# Patient Record
Sex: Female | Born: 1975 | Race: Black or African American | Hispanic: No | Marital: Single | Smoking: Never smoker
Health system: Southern US, Community
[De-identification: ages and names within clinical notes are randomized; demographics above are authoritative.]

## PROBLEM LIST (undated history)

## (undated) DIAGNOSIS — E119 Type 2 diabetes mellitus without complications: Secondary | ICD-10-CM

## (undated) DIAGNOSIS — I10 Essential (primary) hypertension: Secondary | ICD-10-CM

---

## 2017-05-01 ENCOUNTER — Emergency Department: Payer: Medicare Other

## 2017-05-01 ENCOUNTER — Encounter: Payer: Self-pay | Admitting: Emergency Medicine

## 2017-05-01 ENCOUNTER — Emergency Department
Admission: EM | Admit: 2017-05-01 | Discharge: 2017-05-01 | Disposition: A | Discharge status: Home / Self Care | Payer: Medicare Other | Attending: Emergency Medicine | Admitting: Emergency Medicine

## 2017-05-01 DIAGNOSIS — I10 Essential (primary) hypertension: Secondary | ICD-10-CM | POA: Diagnosis not present

## 2017-05-01 DIAGNOSIS — S91001A Unspecified open wound, right ankle, initial encounter: Secondary | ICD-10-CM | POA: Diagnosis present

## 2017-05-01 DIAGNOSIS — Y999 Unspecified external cause status: Secondary | ICD-10-CM | POA: Insufficient documentation

## 2017-05-01 DIAGNOSIS — L97512 Non-pressure chronic ulcer of other part of right foot with fat layer exposed: Secondary | ICD-10-CM | POA: Insufficient documentation

## 2017-05-01 DIAGNOSIS — E109 Type 1 diabetes mellitus without complications: Secondary | ICD-10-CM | POA: Diagnosis not present

## 2017-05-01 DIAGNOSIS — B86 Scabies: Secondary | ICD-10-CM | POA: Diagnosis not present

## 2017-05-01 DIAGNOSIS — Y939 Activity, unspecified: Secondary | ICD-10-CM | POA: Diagnosis not present

## 2017-05-01 DIAGNOSIS — S80861A Insect bite (nonvenomous), right lower leg, initial encounter: Secondary | ICD-10-CM | POA: Insufficient documentation

## 2017-05-01 DIAGNOSIS — W57XXXA Bitten or stung by nonvenomous insect and other nonvenomous arthropods, initial encounter: Secondary | ICD-10-CM | POA: Diagnosis not present

## 2017-05-01 DIAGNOSIS — E10621 Type 1 diabetes mellitus with foot ulcer: Secondary | ICD-10-CM

## 2017-05-01 DIAGNOSIS — Y929 Unspecified place or not applicable: Secondary | ICD-10-CM | POA: Insufficient documentation

## 2017-05-01 DIAGNOSIS — S80862A Insect bite (nonvenomous), left lower leg, initial encounter: Secondary | ICD-10-CM | POA: Diagnosis not present

## 2017-05-01 HISTORY — DX: Essential (primary) hypertension: I10

## 2017-05-01 HISTORY — DX: Type 2 diabetes mellitus without complications: E11.9

## 2017-05-01 LAB — COMPREHENSIVE METABOLIC PANEL
ALBUMIN: 3.1 g/dL — AB (ref 3.5–5.0)
ALK PHOS: 204 U/L — AB (ref 38–126)
ALT: 20 U/L (ref 14–54)
AST: 29 U/L (ref 15–41)
Anion gap: 9 (ref 5–15)
BUN: 9 mg/dL (ref 6–20)
CALCIUM: 9.2 mg/dL (ref 8.9–10.3)
CHLORIDE: 108 mmol/L (ref 101–111)
CO2: 23 mmol/L (ref 22–32)
CREATININE: 0.32 mg/dL — AB (ref 0.44–1.00)
GFR calc Af Amer: >60 mL/min (ref >60)
GFR calc non Af Amer: >60 mL/min (ref >60)
GLUCOSE: 123 mg/dL — AB (ref 65–99)
Potassium: 3.5 mmol/L (ref 3.5–5.1)
SODIUM: 140 mmol/L (ref 135–145)
Total Bilirubin: 0.7 mg/dL (ref 0.3–1.2)
Total Protein: 7.2 g/dL (ref 6.5–8.1)

## 2017-05-01 LAB — CBC WITH DIFFERENTIAL/PLATELET
BASOS PCT: 0 %
Basophils Absolute: 0 10*3/uL (ref 0–0.1)
EOS ABS: 0.2 10*3/uL (ref 0–0.7)
Eosinophils Relative: 2 %
HCT: 29.1 % — ABNORMAL LOW (ref 35.0–47.0)
HEMOGLOBIN: 9.5 g/dL — AB (ref 12.0–16.0)
LYMPHS ABS: 1.7 10*3/uL (ref 1.0–3.6)
Lymphocytes Relative: 23 %
MCH: 22.9 pg — AB (ref 26.0–34.0)
MCHC: 32.8 g/dL (ref 32.0–36.0)
MCV: 69.9 fL — ABNORMAL LOW (ref 80.0–100.0)
MONO ABS: 1.1 10*3/uL — AB (ref 0.2–0.9)
MONOS PCT: 15 %
Neutro Abs: 4.6 10*3/uL (ref 1.4–6.5)
Neutrophils Relative %: 60 %
Platelets: 291 10*3/uL (ref 150–440)
RBC: 4.17 MIL/uL (ref 3.80–5.20)
RDW: 14.8 % — AB (ref 11.5–14.5)
WBC: 7.6 10*3/uL (ref 3.6–11.0)

## 2017-05-01 LAB — GLUCOSE, CAPILLARY: GLUCOSE-CAPILLARY: 106 mg/dL — AB (ref 65–99)

## 2017-05-01 LAB — LACTIC ACID, PLASMA: Lactic Acid, Venous: 1.6 mmol/L (ref 0.5–1.9)

## 2017-05-01 MED ORDER — SODIUM CHLORIDE 0.9 % IV BOLUS (SEPSIS)
1000.0000 mL | Freq: Once | INTRAVENOUS | Status: AC
  Administered 2017-05-01: 1000 mL via INTRAVENOUS

## 2017-05-01 MED ORDER — PERMETHRIN 1 % EX LOTN: 1.0000 "application " | TOPICAL_LOTION | Freq: Once | CUTANEOUS | 1 refills | Status: AC

## 2017-05-01 MED ORDER — IVERMECTIN 3 MG PO TABS: 3.0000 mg | ORAL_TABLET | Freq: Once | ORAL | 0 refills | Status: AC

## 2017-05-01 MED ORDER — CLINDAMYCIN HCL 300 MG PO CAPS: 300.0000 mg | ORAL_CAPSULE | Freq: Four times a day (QID) | ORAL | 0 refills | Status: AC

## 2017-05-01 MED ORDER — CLINDAMYCIN PHOSPHATE 600 MG/50ML IV SOLN
600.0000 mg | Freq: Once | INTRAVENOUS | Status: AC
  Administered 2017-05-01: 600 mg via INTRAVENOUS
  Filled 2017-05-01: qty 50

## 2017-05-01 NOTE — ED Triage Notes (Addendum)
Presents with possible bed bug bites.. States she has had these bites to ankles/feet for several months  Also noticed bites to her back   Positive itching left foot swollen and small open wound noted

## 2017-05-01 NOTE — ED Provider Notes (Signed)
[facility] Emergency Department Provider Note  ____________________________________________  Time seen: Approximately 4:17 PM  I have reviewed the triage vital signs and the nursing notes.   HISTORY  Chief Complaint Insect Bite    HPI Lori Pope is a 41 y.o. female who presents to emergency department complaining of possible bed bug bites, rash/bites to abdomen and back, wound to the right ankle. Patient is a very poor historian. She reports that she has had bedbugs have been providing her bilateral lower extremities. This is been ongoing for weeks. Patient also endorses rash to the abdomen.  Patient also endorses wound to the right medial ankle. Patient reports that she has been covering it with a Band-Aid but has not sought medical care. Patient reports that has been there for "a while." Patient is not able to be more specific on length of time. Patient reports that his she has had intermittent fevers and chills as well as "fever and my ankle." Patient reports pain. She is able to ambulate on her ankle. When asked, patient reports that she used to have diabetes, but she is not sure anymore as she was told that they just "aren't able to control it." Patient denies taking any medications at home for diabetes. Patient denies checking blood sugars. When asked about neuropathy symptoms, patient states "I can still feel my feet."  Patient reports history of diabetes and hypertension. No other known medical problems. Patient denies any daily medications.   Past Medical History:  Diagnosis Date  . Diabetes mellitus without complication (HCC)   . Hypertension     There are no active problems to display for this patient.   History reviewed. No pertinent surgical history.  Prior to Admission medications   Medication Sig Start Date End Date Taking? Authorizing Provider  diphenhydrAMINE (BENADRYL) 25 mg capsule Take 25 mg by mouth every 6 (six) hours as needed.    Yes [provider]  clindamycin (CLEOCIN) 300 MG capsule Take 1 capsule (300 mg total) by mouth 4 (four) times daily. 05/01/17 05/11/17  Jami Bogdanski, Delorise Royals, PA-C  ivermectin (STROMECTOL) 3 MG TABS tablet Take 1 tablet (3 mg total) by mouth once. Take 1 dose now. Take one dose in one week. 05/01/17 05/01/17  Ellyn Rubiano, Delorise Royals, PA-C  permethrin (PERMETHRIN LICE TREATMENT) 1 % lotion Apply 1 application topically once. Shampoo, rinse and towel dry hair, saturate hair and scalp with permethrin. Use over other affected surfaces on abdomen and back. Rinse after 10 min; repeat in 1 week if needed 05/01/17 05/01/17  Kemia Wendel, Delorise Royals, PA-C    Allergies Vancomycin  No family history on file.  Social History Social History  Substance Use Topics  . Smoking status: Never Smoker  . Smokeless tobacco: Never Used  . Alcohol use No      Review of Systems  Constitutional: No fever/chills Eyes: No visual changes. No discharge ENT: No upper respiratory complaints. Cardiovascular: no chest pain. Respiratory: no cough. No SOB. Gastrointestinal: No abdominal pain.  No nausea, no vomiting.  No diarrhea.  No constipation. Genitourinary: Negative for dysuria. No hematuria Musculoskeletal: Positive for right ankle pain with chronic wound Skin: Positive for bug bites over bilateral lower extremities and abdomen and torso Neurological: Negative for headaches, focal weakness or numbness. 10-point ROS otherwise negative.  ____________________________________________   PHYSICAL EXAM:  VITAL SIGNS: ED Triage Vitals [05/01/17 1550]  Enc Vitals Group     BP      Pulse      Resp  Temp      Temp src      SpO2      Weight      Height      Head Circumference      Peak Flow      Pain Score 6     Pain Loc      Pain Edu?      Excl. in GC?      Constitutional: Alert and oriented. Well appearing and in no acute distress. Eyes: Conjunctivae are normal. PERRL. EOMI. Head:  Atraumatic. ENT:      Ears:       Nose: No congestion/rhinnorhea.      Mouth/Throat: Mucous membranes are moist.  Neck: No stridor.   Hematological/Lymphatic/Immunilogical: No cervical lymphadenopathy. Cardiovascular: Normal rate, regular rhythm. Normal S1 and S2.  Good peripheral circulation. Respiratory: Normal respiratory effort without tachypnea or retractions. Lungs CTAB. Good air entry to the bases with no decreased or absent breath sounds. Musculoskeletal: Full range of motion to all extremities. No gross deformities appreciated. Patient has necrotic wound to the right medial ankle. Significant edema, erythema and region. Subcutaneous tissue is visualized and moaned. Patient has good range of motion to the left ankle. Area is tender to palpation. Area is very firm to palpation but no fluctuance. No drainage is noted. Dorsalis pedis pulse is not well appreciated manually. Dorsalis pedis pulse is appreciated with ultrasound and is marked using permanent marker. Patient has full range of motion all digits to the right foot Neurologic:  Normal speech and language. No gross focal neurologic deficits are appreciated.  Skin:  Skin is warm, dry and intact. Bites scattered over lower extremity consistent with bedbugs. Patient has bite marks with linear excoriations consistent with scabies infestation. Psychiatric: Mood and affect are normal. Speech and behavior are normal. Patient exhibits limited but competent insight and judgement.   ____________________________________________   LABS (all labs ordered are listed, but only abnormal results are displayed)  Labs Reviewed  COMPREHENSIVE METABOLIC PANEL - Abnormal; Notable for the following:       Result Value   Glucose, Bld 123 (*)    Creatinine, Ser 0.32 (*)    Albumin 3.1 (*)    Alkaline Phosphatase 204 (*)    All other components within normal limits  CBC WITH DIFFERENTIAL/PLATELET - Abnormal; Notable for the following:    Hemoglobin 9.5  (*)    HCT 29.1 (*)    MCV 69.9 (*)    MCH 22.9 (*)    RDW 14.8 (*)    Monocytes Absolute 1.1 (*)    All other components within normal limits  GLUCOSE, CAPILLARY - Abnormal; Notable for the following:    Glucose-Capillary 106 (*)    All other components within normal limits  CULTURE, BLOOD (ROUTINE X 2)  CULTURE, BLOOD (ROUTINE X 2)  LACTIC ACID, PLASMA  LACTIC ACID, PLASMA  CBG MONITORING, ED   ____________________________________________  EKG   ____________________________________________  RADIOLOGY Festus Barren Banner Huckaba, personally viewed and evaluated these images (plain radiographs) as part of my medical decision making, as well as reviewing the written report by the radiologist.  Dg Ankle Complete Right  Result Date: 05/01/2017 CLINICAL DATA:  Bites thank: PA for several months, hypertension, diabetes mellitus EXAM: RIGHT ANKLE - COMPLETE 3+ VIEW COMPARISON:  None FINDINGS: Lateral plate and multiple screws at the distal fibula post ORIF. Multiple staples and a single wire at the distal tibia with additional wire fragment at the calcaneus as well. Significant deformities  of the distal fibula and tibia post old fractures. Tibiotalar joint fusion. Degenerative changes of the scattered intertarsal joints. Old screw tracts at the tibia. No acute fracture, dislocation or bone destruction. Scattered soft tissue swelling especially posteriorly overlying the distal Achilles tendon. IMPRESSION: Extensive posttraumatic and postsurgical deformities of the RIGHT ankle. No definite acute bony abnormalities. Electronically Signed   By: Ulyses Southward M.D.   On: 05/01/2017 17:33   [place] Extrem Low Right Ltd  Result Date: 05/01/2017 CLINICAL DATA:  Diabetic pressure ulcer medial right ankle. EXAM: ULTRASOUND RIGHT LOWER EXTREMITY LIMITED TECHNIQUE: Ultrasound examination of the lower extremity soft tissues was performed in the area of clinical concern. COMPARISON:  None. FINDINGS: Focused ultrasound  exam was performed in the medial aspect of the right ankle. Soft tissue edema is evident, but no discrete or drainable fluid collection is identified. IMPRESSION: No evidence for drainable abscess within the subcutaneous tissues of the right ankle. Electronically Signed   By: Kennith Center M.D.   On: 05/01/2017 18:18    ____________________________________________    PROCEDURES  Procedure(s) performed:    Procedures    Medications  sodium chloride 0.9 % bolus 1,000 mL (0 mLs Intravenous Stopped 05/01/17 1857)  clindamycin (CLEOCIN) IVPB 600 mg (600 mg Intravenous New Bag/Given 05/01/17 1856)     ____________________________________________   INITIAL IMPRESSION / ASSESSMENT AND PLAN / ED COURSE  Pertinent labs & imaging results that were available during my care of the patient were reviewed by me and considered in my medical decision making (see chart for details).  Review of the [place] CSRS was performed in accordance of the [facility] prior to dispensing any controlled drugs.     Patient's diagnosis is consistent with diabetic foot ulcer with scabies and bedbug". Patient presented initially with complaints of bedbugs and scabies. Patient also endorsed a wound to the medial right ankle. Patient was evaluated with x-ray and ultrasound to rule out osteomyelitis and significant underlying abscess. Both returned with reassuring results. Labs are surprisingly reassuring with patient's blood sugar within normal limits. Patient verbalizes that she is diabetic but does not take daily medications and does not check her blood glucose. Wound is cleansed and dressed in the emergency department with nonadherent dressing. She is given IV antibiotics. She will be referred to wound care for further management of diabetic foot ulcer. 9, no indication for further workup. Patient is stable to be discharged..  Patient is given ED precautions to return to the ED for any worsening or new  symptoms.     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   IVERMECTIN (STROMECTOL) 3 MG TABS TABLET    Take 1 tablet (3 mg total) by mouth once. Take 1 dose now. Take one dose in one week.   PERMETHRIN (PERMETHRIN LICE TREATMENT) 1 % LOTION    Apply 1 application topically once. Shampoo, rinse and towel dry hair, saturate hair and scalp with permethrin. Use over other affected surfaces on abdomen and back. Rinse after 10 min; repeat in 1 week if needed        This chart was dictated using  voice recognition software/Dragon. Despite best efforts to proofread, errors can occur which can change the meaning. Any change was purely unintentional.    Racheal PatchesCuthriell, Masey Scheiber D, PA-C 05/01/17 2131    Phineas SemenGoodman, Graydon, MD 05/01/17 2256

## 2017-05-01 NOTE — ED Notes (Signed)
Patient informed that discharge papers are ready. Given phone to try to contact ride.

## 2017-05-05 ENCOUNTER — Encounter: Payer: Self-pay | Admitting: Emergency Medicine

## 2017-05-05 ENCOUNTER — Emergency Department
Admission: EM | Admit: 2017-05-05 | Discharge: 2017-05-05 | Disposition: A | Discharge status: Home / Self Care | Payer: Medicare Other | Attending: Emergency Medicine | Admitting: Emergency Medicine

## 2017-05-05 DIAGNOSIS — W57XXXD Bitten or stung by nonvenomous insect and other nonvenomous arthropods, subsequent encounter: Secondary | ICD-10-CM | POA: Insufficient documentation

## 2017-05-05 DIAGNOSIS — E10621 Type 1 diabetes mellitus with foot ulcer: Secondary | ICD-10-CM | POA: Diagnosis not present

## 2017-05-05 DIAGNOSIS — B86 Scabies: Secondary | ICD-10-CM | POA: Insufficient documentation

## 2017-05-05 DIAGNOSIS — I10 Essential (primary) hypertension: Secondary | ICD-10-CM | POA: Diagnosis not present

## 2017-05-05 DIAGNOSIS — L97512 Non-pressure chronic ulcer of other part of right foot with fat layer exposed: Secondary | ICD-10-CM | POA: Diagnosis not present

## 2017-05-05 DIAGNOSIS — R21 Rash and other nonspecific skin eruption: Secondary | ICD-10-CM | POA: Diagnosis present

## 2017-05-05 MED ORDER — CLINDAMYCIN HCL 150 MG PO CAPS
300.0000 mg | ORAL_CAPSULE | Freq: Once | ORAL | Status: AC
  Administered 2017-05-05: 300 mg via ORAL
  Filled 2017-05-05: qty 2

## 2017-05-05 NOTE — ED Notes (Signed)
Discharge instructions given to pt  The cab was called and will inform pt when cab gets here

## 2017-05-05 NOTE — ED Notes (Signed)
Cab has arrived to take patient home, patient taken to lobby

## 2017-05-05 NOTE — ED Triage Notes (Signed)
Brought in via ems from Health dept for rash  Was seen 3 days ago for scabies and bed bug bites  Was unable to get her rx filled for her antibiotics . States she has no place to stay   Requesting to speak to Child psychotherapistsocial worker.

## 2017-05-05 NOTE — ED Provider Notes (Signed)
[facility] Emergency Department Provider Note  ____________________________________________  Time seen: Approximately 5:01 PM  I have reviewed the triage vital signs and the nursing notes.   HISTORY  Chief Complaint Rash    HPI Lori Pope is a 41 y.o. female who presents emergency department for further complaint of bug bites, scabies, diabetic foot wound. Patient was seen by myself 4 days earlier and diagnosed with the above diagnosis. Patient had presented to health department to request assistance filling her prescribed medications. Health department refused to see the patient and would not let her in the front doors. EMS was called to transport the patient to the emergency department. Patient has no complaints at this time. She is homeless, having been refused shelter at the homeless shelter after she was in a verbal altercation with another resident homeless shelter over reported felt of personal belongings. Patient had been staying at a relatives house but is no longer able to stay there. Patient endorses ongoing issues with bedbug bites, scabies, for ulcer. However she denies any new complaints. She denies any worsening of her previously diagnosed complaints.  Patient presented to the emergency department via EMS from the health Department. Per EMS, they were called to the patient because "the health Department will not see her because they reported that they were closing, did not want to be exposed to scabies, and couldn't deal with the patient and placing her in a new shelter." EMS was told to bring her"to the emergency department and they will get social workers involved to adequately address the patient." EMS reports that the health department would not allow the patient in their front doors. Patient was found outside. Patient was not evaluated by any staff at the health department.   Past Medical History:  Diagnosis Date  . Diabetes mellitus without  complication (HCC)   . Hypertension     There are no active problems to display for this patient.   History reviewed. No pertinent surgical history.  Prior to Admission medications   Medication Sig Start Date End Date Taking? Authorizing Provider  clindamycin (CLEOCIN) 300 MG capsule Take 1 capsule (300 mg total) by mouth 4 (four) times daily. 05/01/17 05/11/17  Cuthriell, Delorise Royals, PA-C  diphenhydrAMINE (BENADRYL) 25 mg capsule Take 25 mg by mouth every 6 (six) hours as needed.    [provider]    Allergies Vancomycin  No family history on file.  Social History Social History  Substance Use Topics  . Smoking status: Never Smoker  . Smokeless tobacco: Never Used  . Alcohol use No     Review of Systems  Constitutional: No fever/chills Eyes: No visual changes. No discharge ENT: No upper respiratory complaints. Cardiovascular: no chest pain. Respiratory: no cough. No SOB. Gastrointestinal: No abdominal pain.  No nausea, no vomiting.  No diarrhea.  No constipation. Genitourinary: Negative for dysuria. No hematuria Musculoskeletal: Negative for musculoskeletal pain. Skin: Positive for scabies mite to torso. Positive for scattered bedbug bites. Positive for diabetic foot ulcer to the medial right ankle.  Neurological: Negative for headaches, focal weakness or numbness. 10-point ROS otherwise negative.  ____________________________________________   PHYSICAL EXAM:  VITAL SIGNS: ED Triage Vitals  Enc Vitals Group     BP      Pulse      Resp      Temp      Temp src      SpO2      Weight      Height  Head Circumference      Peak Flow      Pain Score      Pain Loc      Pain Edu?      Excl. in GC?      Constitutional: Alert and oriented. Well appearing and in no acute distress. Eyes: Conjunctivae are normal. PERRL. EOMI. Head: Atraumatic. ENT:      Ears:       Nose: No congestion/rhinnorhea.      Mouth/Throat: Mucous membranes are moist.   Neck: No stridor.   Hematological/Lymphatic/Immunilogical: No cervical lymphadenopathy. Cardiovascular: Normal rate, regular rhythm. Normal S1 and S2.  Good peripheral circulation. Respiratory: Normal respiratory effort without tachypnea or retractions. Lungs CTAB. Good air entry to the bases with no decreased or absent breath sounds. Musculoskeletal: Full range of motion to all extremities. No gross deformities appreciated. Neurologic:  Normal speech and language. No gross focal neurologic deficits are appreciated.  Skin:  Skin is warm, dry and intact. Positive for scabies mite tracks to torso. Significant infestation. Positive for scattered bedbug bites to extremities and torso. Patient has a diabetic foot ulcer to the right medial ankle. This does extend into the subcutaneous tissue. There is surrounding erythema and edema. When compared with previous visit 4 days prior, there is improvement in visual appearance of lesion. Area is still tender to palpation. No drainage. Dorsalis pedis pulse intact. Sensation intact all 5 digits right foot. Psychiatric: Mood and affect are normal. Speech and behavior are normal. Patient exhibits appropriate insight and judgement.   ____________________________________________   LABS (all labs ordered are listed, but only abnormal results are displayed)  Labs Reviewed - No data to display ____________________________________________  EKG   ____________________________________________  RADIOLOGY   No results found.  ____________________________________________    PROCEDURES  Procedure(s) performed:    Procedures    Medications  clindamycin (CLEOCIN) capsule 300 mg (300 mg Oral Given 05/05/17 1729)     ____________________________________________   INITIAL IMPRESSION / ASSESSMENT AND PLAN / ED COURSE  Pertinent labs & imaging results that were available during my care of the patient were reviewed by me and considered in my medical  decision making (see chart for details).  Review of the [place] CSRS was performed in accordance of the [facility] prior to dispensing any controlled drugs.     Patient's diagnosis is consistent with continued scabies, bedbugs, foot ulceration. The patient presented from the health department after she was turned down by DSS for evaluation. Patient had no new medical complaints at this time. Patient is diabetic foot ulcer is a better appearance than previous visit 4 days prior. At this time, no workup. At this time, there is no medical necessity to admit the patient. I have talked with social workers and they are unable to place patient as there is no imminent medical risk. Patient is competent to make her own decisions and as such she will be discharged. He has prescriptions from previous visit and no new prescriptions at this time. She will follow up with DSS for further basement for her homelessness status.. Patient is given ED precautions to return to the ED for any worsening or new symptoms.     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 Despite best efforts to proofread, errors can occur which can change the meaning. Any change was purely unintentional.    Racheal PatchesCuthriell, Jonathan D, PA-C 05/05/17 Charlott [id number]    Williams, Jonathan E, MD 05/05/17 2127

## 2017-05-06 LAB — CULTURE, BLOOD (ROUTINE X 2)
CULTURE: NO GROWTH
CULTURE: NO GROWTH
Special Requests: ADEQUATE

## 2017-05-07 ENCOUNTER — Emergency Department: Payer: Medicare Other

## 2017-05-07 ENCOUNTER — Emergency Department
Admission: EM | Admit: 2017-05-07 | Discharge: 2017-05-07 | Disposition: A | Discharge status: Home / Self Care | Payer: Medicare Other | Attending: Emergency Medicine | Admitting: Emergency Medicine

## 2017-05-07 DIAGNOSIS — I10 Essential (primary) hypertension: Secondary | ICD-10-CM | POA: Insufficient documentation

## 2017-05-07 DIAGNOSIS — B86 Scabies: Secondary | ICD-10-CM | POA: Diagnosis not present

## 2017-05-07 DIAGNOSIS — E119 Type 2 diabetes mellitus without complications: Secondary | ICD-10-CM | POA: Diagnosis not present

## 2017-05-07 DIAGNOSIS — R21 Rash and other nonspecific skin eruption: Secondary | ICD-10-CM

## 2017-05-07 MED ORDER — PERMETHRIN 5 % EX CREA
TOPICAL_CREAM | Freq: Once | CUTANEOUS | Status: AC
  Administered 2017-05-07: 1 via TOPICAL
  Filled 2017-05-07: qty 60

## 2017-05-07 NOTE — ED Notes (Signed)
Preparing for discharge, pt requesting to stay another day; then to shower.  Pt given Homeless resources pt now requesting cab.

## 2017-05-07 NOTE — ED Notes (Signed)
Pt states she would like her gown to be changed. Pt given new gown, sock and underwear to wear.

## 2017-05-07 NOTE — ED Triage Notes (Signed)
Pt presents via POV c/o rash. Reports diagnosed with scabies but unable to get medication. Reports generalized itching over body. Also has wound to right ankle to was seen prior per pt report.

## 2017-05-07 NOTE — ED Notes (Signed)
Pt sleeping at this time, equal and unlabored resp noted. Pt in NAD at this time.

## 2017-05-07 NOTE — ED Notes (Signed)
Pt requested another gown to wear which was given to pt. Pt also states she would like her linens changed which was completed. Pt lying in bed in NAD at this time.
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
## 2017-05-07 NOTE — ED Notes (Signed)
Pt requested juice and for her phone to be plugged into charger. Pt given cranberry juice and phone plugged into pt's charger in rm.

## 2017-05-07 NOTE — ED Notes (Signed)
Contact precautions placed on pt's door.

## 2017-05-07 NOTE — ED Notes (Signed)
This EDT responded to staff assist light being pulled. This tech found pt in room pulling out multiple linens onto the floor, changing gowns and taking sheets off the bed. Pt st: " The rooms dirty, my bed is dirty... I need to change.. Change my bed. I can't." pt repeats comment x3. Pt independent and ambulatory with no assist. Pt was assisted with such. RN notified

## 2017-05-07 NOTE — ED Notes (Signed)
Kinner MD seeing pt in triage.

## 2017-05-07 NOTE — ED Notes (Signed)
Permethrin cream applied to pt's body, (pt applied lotion to stomach/chest and bottom) this RN applied cream to back, legs, arms, feet, neck.  Pt lying in bed at this time in NAD. Blankets and water given to pt. Pt watching tv at this time.

## 2017-05-08 ENCOUNTER — Emergency Department
Admission: EM | Admit: 2017-05-08 | Discharge: 2017-05-08 | Disposition: A | Discharge status: Home / Self Care | Payer: Medicare Other | Attending: Emergency Medicine | Admitting: Emergency Medicine

## 2017-05-08 DIAGNOSIS — W57XXXD Bitten or stung by nonvenomous insect and other nonvenomous arthropods, subsequent encounter: Secondary | ICD-10-CM | POA: Diagnosis not present

## 2017-05-08 DIAGNOSIS — E10621 Type 1 diabetes mellitus with foot ulcer: Secondary | ICD-10-CM | POA: Diagnosis not present

## 2017-05-08 DIAGNOSIS — B888 Other specified infestations: Secondary | ICD-10-CM | POA: Diagnosis not present

## 2017-05-08 DIAGNOSIS — B86 Scabies: Secondary | ICD-10-CM | POA: Insufficient documentation

## 2017-05-08 DIAGNOSIS — L97519 Non-pressure chronic ulcer of other part of right foot with unspecified severity: Secondary | ICD-10-CM | POA: Insufficient documentation

## 2017-05-08 DIAGNOSIS — L97512 Non-pressure chronic ulcer of other part of right foot with fat layer exposed: Secondary | ICD-10-CM

## 2017-05-08 MED ORDER — CLINDAMYCIN HCL 150 MG PO CAPS
300.0000 mg | ORAL_CAPSULE | Freq: Once | ORAL | Status: AC
  Administered 2017-05-08: 300 mg via ORAL
  Filled 2017-05-08: qty 2

## 2017-05-08 MED ORDER — IVERMECTIN 3 MG PO TABS
21.0000 mg | ORAL_TABLET | Freq: Once | ORAL | Status: AC
  Administered 2017-05-08: 21 mg via ORAL
  Filled 2017-05-08: qty 7

## 2017-05-08 NOTE — ED Triage Notes (Signed)
Pt [facility] ER [facility]. Pt initially stated that she was SOB prior to arrival, denies at this time. Pt c/o right ankle pain from bug bite at this time.
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
## 2017-05-08 NOTE — ED Notes (Signed)
See triage note  This pt presents for the 4 th time with scabies and bed bug bites  States she is not able to get her meds filled  States she went to health dept and was sent here for further eval.

## 2017-05-09 ENCOUNTER — Emergency Department (HOSPITAL_COMMUNITY)
Admission: EM | Admit: 2017-05-09 | Discharge: 2017-05-09 | Disposition: A | Discharge status: Home / Self Care | Payer: Medicare Other | Source: Home / Self Care | Attending: Emergency Medicine | Admitting: Emergency Medicine

## 2017-05-09 ENCOUNTER — Emergency Department
Admission: EM | Admit: 2017-05-09 | Discharge: 2017-05-09 | Discharge status: Against Medical Advice | Payer: Medicare Other | Attending: Emergency Medicine | Admitting: Emergency Medicine

## 2017-05-09 ENCOUNTER — Encounter (HOSPITAL_COMMUNITY): Payer: Self-pay | Admitting: *Deleted

## 2017-05-09 DIAGNOSIS — E119 Type 2 diabetes mellitus without complications: Secondary | ICD-10-CM | POA: Insufficient documentation

## 2017-05-09 DIAGNOSIS — Y999 Unspecified external cause status: Secondary | ICD-10-CM | POA: Insufficient documentation

## 2017-05-09 DIAGNOSIS — R21 Rash and other nonspecific skin eruption: Secondary | ICD-10-CM | POA: Diagnosis present

## 2017-05-09 DIAGNOSIS — Y939 Activity, unspecified: Secondary | ICD-10-CM | POA: Insufficient documentation

## 2017-05-09 DIAGNOSIS — I10 Essential (primary) hypertension: Secondary | ICD-10-CM | POA: Insufficient documentation

## 2017-05-09 DIAGNOSIS — Z590 Homelessness: Secondary | ICD-10-CM | POA: Insufficient documentation

## 2017-05-09 DIAGNOSIS — B86 Scabies: Secondary | ICD-10-CM | POA: Diagnosis not present

## 2017-05-09 DIAGNOSIS — Y929 Unspecified place or not applicable: Secondary | ICD-10-CM | POA: Diagnosis not present

## 2017-05-09 DIAGNOSIS — Z79899 Other long term (current) drug therapy: Secondary | ICD-10-CM | POA: Diagnosis not present

## 2017-05-09 DIAGNOSIS — M7989 Other specified soft tissue disorders: Secondary | ICD-10-CM | POA: Insufficient documentation

## 2017-05-09 DIAGNOSIS — M25579 Pain in unspecified ankle and joints of unspecified foot: Secondary | ICD-10-CM | POA: Diagnosis not present

## 2017-05-09 DIAGNOSIS — S80869A Insect bite (nonvenomous), unspecified lower leg, initial encounter: Secondary | ICD-10-CM | POA: Diagnosis not present

## 2017-05-09 DIAGNOSIS — L299 Pruritus, unspecified: Secondary | ICD-10-CM | POA: Diagnosis present

## 2017-05-09 DIAGNOSIS — W57XXXA Bitten or stung by nonvenomous insect and other nonvenomous arthropods, initial encounter: Secondary | ICD-10-CM | POA: Insufficient documentation

## 2017-05-09 NOTE — Discharge Instructions (Signed)
Please read attached information. If you experience any new or worsening signs or symptoms please return to the emergency room for evaluation. Please follow-up with your primary care provider or specialist as discussed. Please use medication prescribed only as directed and discontinue taking if you have any concerning signs or symptoms.   °

## 2017-05-09 NOTE — ED Notes (Signed)
Pt requesting to speak to a Child psychotherapistocial Worker.

## 2017-05-09 NOTE — ED Notes (Addendum)
Pt asked if she is ready to be examined by the physician yet. Pt states "I will wait for a room". Pt informed that she will be seeing the MD from a hallway bed today due to the large volume of pt in the ED. Pt states she wants privacy to have her ankle examined. Pt informed that we have several screens that we are able to provide around stretcher so pt is able to be examined privately. Pt states she will just wait for a room on the stretcher in the hallway or she will "go to the lobby and just check back in and wait there for a room to open up". Pt told there were multiple screens that can encase around the entire stretcher so that no one would be able to see inside to where she was being examined. Pt refused. Pt asked if she is refusing treatment and pt states I am not refusing treatment just wont be seen until I'm in a room. Pt reminded that since it is her ankle that needs to be examined that she wont even have to undress to be assessed by MD. pt states "well I have other things going on. I need to wait for my knee xray." MD aware of conversation.

## 2017-05-09 NOTE — ED Notes (Signed)
Warm blanket given to pt

## 2017-05-09 NOTE — ED Notes (Signed)
Pt states she will not let this RN assess her right or left ankle at this time. Pt states "when I get a room you can see it." This RN reiterated to pt that this bed is a hospital bed and pt's are treated in this bed. Pt states "I will wait till I get a room." Charge RN aware.

## 2017-05-09 NOTE — ED Notes (Signed)
Social work is unavailable today. Pt is given bus pass and resources [facility].

## 2017-05-09 NOTE — ED Notes (Signed)
Pt is complaining the room is dirty and "I'm already itching". Pt continues to open up the door and come out and walk around. Pt is scratching and has c/o scabies. Pt asked to speak to a Child psychotherapistsocial worker again. When asked what kind of help she needed she states, "It's private". Pt then goes on to state there are bags in her chair. Pt placed the bags in her chairs because she states they were dirty. The room was wiped down with bleach prior to the patient coming in.

## 2017-05-09 NOTE — ED Notes (Signed)
Dinamap is not working at this time to assess pt's vitals, this RN will follow up with new dinamap to get VS.
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
## 2017-05-09 NOTE — ED Notes (Signed)
This RN tried to assess pt again, pt states she will answer questions at this time.

## 2017-05-09 NOTE — ED Notes (Signed)
EDP at bedside with pt to assess pt. EDP tried to assess pt however pt states "I will not show you till I get a room." EDP notified pt that she is here to see pt and treat her at this time however pt states again "no, I want to wait till I get a room." Charge RN notified of this as well.

## 2017-05-09 NOTE — ED Provider Notes (Signed)
[facility] Emergency Department Provider Note   ____________________________________________   First MD Initiated Contact with Patient 05/09/17 0103     (approximate)  I have reviewed the triage vital signs and the nursing notes.   HISTORY  Chief Complaint Other (Itching all over, hx scabies) and Ankle Pain  Limited by patient uncooperative; refusing interview and examination  HPI Lori Pope is a 41 y.o. female who returns to the ED after prior visit tonight for itching secondary to scabies and refuses interview and examination until she is placed into a treatment room.She is currently in a hallway bed secondary to large volume of waiting patients. Charge nurse notified. Nursing supervisor has been involved as well. Reviewed prior multiple visits; appears to have ongoing social issue with DSS involvement and patient refusing to go where she has been offered placement in both [place] as well as [place]. Apparently [place] to go to [place].   Past Medical History:  Diagnosis Date  . Diabetes mellitus without complication (HCC)   . Hypertension     There are no active problems to display for this patient.   History reviewed. No pertinent surgical history.  Prior to Admission medications   Medication Sig Start Date End Date Taking? Authorizing Provider  clindamycin (CLEOCIN) 300 MG capsule Take 1 capsule (300 mg total) by mouth 4 (four) times daily. 05/01/17 05/11/17  Cuthriell, Delorise RoyalsJonathan D, PA-C  diphenhydrAMINE (BENADRYL) 25 mg capsule Take 25 mg by mouth every 6 (six) hours as needed.    [provider]    Allergies Clindamycin/lincomycin and Vancomycin  No family history on file.  Social History Social History  Substance Use Topics  . Smoking status: Never Smoker  . Smokeless tobacco: Never Used  . Alcohol use No    Review of Systems  Constitutional: No fever/chills. Eyes: No visual changes. ENT: No  sore throat. Cardiovascular: Denies chest pain. Respiratory: Denies shortness of breath. Gastrointestinal: No abdominal pain.  No nausea, no vomiting.  No diarrhea.  No constipation. Genitourinary: Negative for dysuria. Musculoskeletal: Negative for back pain. Skin: Positive for itching secondary to scabies rash. Neurological: Negative for headaches, focal weakness or numbness.  Limited secondary to patient uncooperative  ____________________________________________   PHYSICAL EXAM:  VITAL SIGNS: ED Triage Vitals [05/09/17 0015]  Enc Vitals Group     BP      Pulse      Resp      Temp      Temp src      SpO2      Weight 244 lb (110.7 kg)     Height 5\' 7"  (1.702 m)     Head Circumference      Peak Flow      Pain Score 10     Pain Loc      Pain Edu?      Excl. in GC?    Unable to perform physical exam; patient refused. External look from less than 5 feet with patient attempting to cover herself with a blanket. Constitutional: Alert and oriented. Well appearing and in no acute distress. Eyes: Conjunctivae are normal.  Head: Atraumatic. Nose: No congestion/rhinnorhea. Mouth/Throat: Mucous membranes are moist.   Neck: Unable.   Cardiovascular: Unable. Respiratory: Unable. Gastrointestinal: Unable. Musculoskeletal: Unable. Neurologic:  Normal speech and language. No gross focal neurologic deficits are appreciated. MAEx4. No gait instability. Skin:  Unable. Psychiatric: Unable. ____________________________________________   LABS (all labs ordered are listed, but only abnormal results are displayed)  Labs Reviewed - No data to display ____________________________________________  EKG  None ____________________________________________  RADIOLOGY  No results found.  ____________________________________________   PROCEDURES  Procedure(s) performed: None  Procedures  Critical Care performed: No  ____________________________________________   INITIAL  IMPRESSION / ASSESSMENT AND PLAN / ED COURSE  Pertinent labs & imaging results that were available during my care of the patient were reviewed by me and considered in my medical decision making (see chart for details).  41 year old female who returns to the ED hours after she had been discharged for itching secondary to scabies. Patient has had multiple recent visits to this facility as well as in [place]. She had been seen for scabies and diabetic wound which is documented to be healing per physician assistant provider's note. It also appears that DSS is involved and offering to place patient but she refuses because it is not where she wishes to live. I attempted several times to interview and examine patient but she continuously refuses and places the blanket over her head, trying to go to sleep. Charge nurse and nursing supervisor attempting to speak with patient.  Clinical Course as of May 10 311  Tue May 09, 2017  0214 Patient was offered privacy screen but refused. States she would rather wait in the lobby until a treatment room is available. Told nurse she would "check back in with a different complaint". Police were at bedside but patient did not require their assistance. She ambulated with steady gait voluntarily to the waiting room.  [JS]    Clinical Course User Index [JS] Irean Hong, MD     ____________________________________________   FINAL CLINICAL IMPRESSION(S) / ED DIAGNOSES  Final diagnoses:  Itching      NEW MEDICATIONS STARTED DURING THIS VISIT:  New Prescriptions   No medications on file     Note:  This document was prepared using Dragon voice recognition software and may include unintentional dictation errors.    Irean Hong, MD 05/09/17 [phone]

## 2017-05-09 NOTE — ED Notes (Signed)
Pt is in stable condition upon d/c. Pt states she wants a cab voucher. Pt informed she was given a bus pass and we will not provide her with a cab voucher. Pt states, "Then you need to call a cab for me and I'll pay for that and a room by myself". Pt informed she may use the phone in the lobby to call a cab once she is ready to be picked up.

## 2017-05-09 NOTE — ED Triage Notes (Signed)
Pt arrives with c/o scabies.

## 2017-05-09 NOTE — ED Notes (Addendum)
Pt continuing to refuse treatment at this time, pt states she will go to check herself back in. Pt ambulated to officer asking for ride to [place]. Officer told pt can be transported to [facility] which pt was taken to. Pt ambulatory without difficulty. Pt refused to sign AMA form prior to leaving.

## 2017-05-09 NOTE — ED Triage Notes (Signed)
Per EMS, pt reports "itching all over" and right ankle pain and left ankle swelling. Pt with hx of scabies and wound to right ankle. Pt states she was seen earlier today. Pt states hx of DM and HTN. Pt alert at this time.

## 2017-05-09 NOTE — ED Notes (Addendum)
Pt has talked with nurse supervisor and charge RN, this RN placed privacy curtains at pt's bedside, while doing so pt states "I do not want those around my bed." This RN stated to pt it is for her privacy and for the Dr to assess you however pt states again "I do not want those, I am just going to go and check back in up front and tell them something different so I can get a room." Charge RN notified of this.

## 2017-05-09 NOTE — ED Notes (Addendum)
This RN tried to assess pt, pt states "I will not answer questions until I am in a room." Pt has been told by Charge RN the bed she is in now is her ED hospital bed. Pt states "I am requesting a room" which Charge RN has notified pt a room is not available and that [facility] is where she will be treated.

## 2017-05-10 ENCOUNTER — Encounter (HOSPITAL_COMMUNITY): Payer: Self-pay

## 2017-05-10 ENCOUNTER — Emergency Department (HOSPITAL_COMMUNITY)
Admission: EM | Admit: 2017-05-10 | Discharge: 2017-05-10 | Discharge status: Against Medical Advice | Payer: Medicare Other | Attending: Emergency Medicine | Admitting: Emergency Medicine

## 2017-05-10 ENCOUNTER — Ambulatory Visit: Payer: Medicare Other | Admitting: Physician Assistant

## 2017-05-10 DIAGNOSIS — B86 Scabies: Secondary | ICD-10-CM | POA: Diagnosis not present

## 2017-05-10 DIAGNOSIS — W57XXXA Bitten or stung by nonvenomous insect and other nonvenomous arthropods, initial encounter: Secondary | ICD-10-CM

## 2017-05-10 DIAGNOSIS — M7989 Other specified soft tissue disorders: Secondary | ICD-10-CM

## 2017-05-10 MED ORDER — PERMETHRIN 5 % EX CREA
TOPICAL_CREAM | Freq: Once | CUTANEOUS | Status: AC
  Administered 2017-05-10: 01:00:00 via TOPICAL
  Filled 2017-05-10: qty 60

## 2017-05-10 NOTE — ED Provider Notes (Signed)
Patient refusing blood work.  She appears stable.  VSS.   Treat with permethrin.  DC with PCP/outpatient follow-up.   Roxy HorsemanBrowning, Bonniejean Piano, PA-C 05/10/17 [id number]    Nicanor AlconPalumbo, April, MD 05/10/17 [phone]

## 2017-05-10 NOTE — ED Notes (Signed)
Assisted pt with applying the cream, pt given a bus pass and will be woken up in two hours

## 2017-05-10 NOTE — ED Notes (Signed)
Pt refused blood draw

## 2017-05-10 NOTE — ED Provider Notes (Cosign Needed)
[facility]-EMERGENCY DEPT Provider Note   CSN: [id number] Arrival date & time: 05/09/17  2346     History   Chief Complaint Chief Complaint  Patient presents with  . scabies    HPI Lori Pope is a [age] y.o. female.  HPI Lori Pope is a 41 y.o. female with hx of DM, HTN, presents to ED with complaint of a rash and leg swelling. Pt states she was diagnosed with scabies but unable to fill her medications. States her legs are more swollen than usual. States she is itching everywhere. Also reports an ulcer to right lower leg that is not healing. No fever or chills. Pt is homeless.   Past Medical History:  Diagnosis Date  . Diabetes mellitus without complication (HCC)   . Hypertension     There are no active problems to display for this patient.   History reviewed. No pertinent surgical history.  OB History    No data available       Home Medications    Prior to Admission medications   Medication Sig Start Date End Date Taking? Authorizing Provider  clindamycin (CLEOCIN) 300 MG capsule Take 1 capsule (300 mg total) by mouth 4 (four) times daily. 05/01/17 05/11/17  Cuthriell, Delorise RoyalsJonathan D, PA-C  diphenhydrAMINE (BENADRYL) 25 mg capsule Take 25 mg by mouth every 6 (six) hours as needed.    [provider]    Family History History reviewed. No pertinent family history.  Social History Social History  Substance Use Topics  . Smoking status: Never Smoker  . Smokeless tobacco: Never Used  . Alcohol use No     Allergies   Clindamycin/lincomycin and Vancomycin   Review of Systems Review of Systems  Constitutional: Negative for chills and fever.  Respiratory: Negative for cough, chest tightness and shortness of breath.   Cardiovascular: Positive for leg swelling. Negative for chest pain and palpitations.  Gastrointestinal: Negative for abdominal pain, diarrhea, nausea and vomiting.  Genitourinary: Negative for dysuria, flank pain, pelvic pain, vaginal bleeding,  vaginal discharge and vaginal pain.  Musculoskeletal: Positive for arthralgias and myalgias. Negative for neck pain and neck stiffness.  Skin: Positive for rash.  Neurological: Negative for dizziness, weakness and headaches.  All other systems reviewed and are negative.    Physical Exam Updated Vital Signs LMP 05/01/2017 (Exact Date)   Physical Exam  Constitutional: She appears well-developed and well-nourished. No distress.  HENT:  Head: Normocephalic.  Eyes: Conjunctivae are normal.  Neck: Neck supple.  Cardiovascular: Normal rate, regular rhythm and normal heart sounds.   Pulmonary/Chest: Effort normal and breath sounds normal. No respiratory distress. She has no wheezes. She has no rales.  Abdominal: Soft. Bowel sounds are normal. She exhibits no distension. There is no tenderness. There is no rebound.  Musculoskeletal: She exhibits edema.  2+ bilateral lower extremity edema, mainly in the ankles and feet. Tenderness to palpation of the left knee, pain with range of motion.  Neurological: She is alert.  Skin: Skin is warm and dry.  Erythematous, scaly, papular rash to bilateral feet, between her toes, the bilateral arms, inguinal areas. Ulcer to the medial right lower leg. Some white drainage. No erythema or warm to the touch.   Psychiatric: She has a normal mood and affect. Her behavior is normal.  Nursing note and vitals reviewed.    ED Treatments / Results  Labs (all labs ordered are listed, but only abnormal results are displayed) Labs Reviewed  CBC WITH DIFFERENTIAL/PLATELET  BASIC METABOLIC PANEL  BRAIN  NATRIURETIC PEPTIDE    EKG  EKG Interpretation None       Radiology No results found.  Procedures Procedures (including critical care time)  Medications Ordered in ED Medications  permethrin (ELIMITE) 5 % cream (not administered)     Initial Impression / Assessment and Plan / ED Course  I have reviewed the triage vital signs and the nursing  notes.  Pertinent labs & imaging results that were available during my care of the patient were reviewed by me and considered in my medical decision making (see chart for details).     Patient in emergency department with complaint of rash. She has had several visits to the ED,  diagnosed with scabies and bed bugs. She has been given prescription for permethrin which she states she did not fill, because she does not have money. I will order her some medicine here in ED. She has been seen twice in ED yesterday and once 2 days ago for the same thing. Patient is also complaining of an ulcer to the right lower leg that is chronic. No obvious signs of infection. Discussed wound care. Patient has been provided resources for her homelessness. We will get labs today to evaluate for worsening renal function and BNP for further evaluation of bilateral leg swelling. She states that her leg swelling is much worse today than usual. If unremarkable, she can follow-up with her family doctor.   Signed out to PA [employer] at shift change.    Final Clinical Impressions(s) / ED Diagnoses   Final diagnoses:  None    New Prescriptions New Prescriptions   No medications on file     Jaynie Crumble, PA-C 05/10/17 0123

## 2017-05-10 NOTE — ED Notes (Signed)
Bed: [facility] Expected date:  Expected time:  Means of arrival:  Comments: 

## 2017-05-10 NOTE — ED Notes (Signed)
Patient refused to let me collect lab work

## 2017-05-10 NOTE — ED Triage Notes (Signed)
Pt was dx with scabies a few days ago at [facility], she hasn't been able to get her prescriptions and needs some  medications

## 2017-05-10 NOTE — ED Notes (Signed)
Attempted to get blood from pt, but pt refused. 

## 2017-05-29 ENCOUNTER — Encounter: Payer: Self-pay | Admitting: Emergency Medicine

## 2017-05-29 ENCOUNTER — Emergency Department
Admission: EM | Admit: 2017-05-29 | Discharge: 2017-05-29 | Disposition: A | Discharge status: Home / Self Care | Payer: Medicare Other | Attending: Emergency Medicine | Admitting: Emergency Medicine

## 2017-05-29 DIAGNOSIS — B86 Scabies: Secondary | ICD-10-CM | POA: Diagnosis not present

## 2017-05-29 DIAGNOSIS — L97529 Non-pressure chronic ulcer of other part of left foot with unspecified severity: Secondary | ICD-10-CM | POA: Insufficient documentation

## 2017-05-29 DIAGNOSIS — E10621 Type 1 diabetes mellitus with foot ulcer: Secondary | ICD-10-CM | POA: Insufficient documentation

## 2017-05-29 DIAGNOSIS — E119 Type 2 diabetes mellitus without complications: Secondary | ICD-10-CM | POA: Insufficient documentation

## 2017-05-29 DIAGNOSIS — R21 Rash and other nonspecific skin eruption: Secondary | ICD-10-CM | POA: Diagnosis present

## 2017-05-29 DIAGNOSIS — I10 Essential (primary) hypertension: Secondary | ICD-10-CM | POA: Diagnosis not present

## 2017-05-29 DIAGNOSIS — L97528 Non-pressure chronic ulcer of other part of left foot with other specified severity: Secondary | ICD-10-CM

## 2017-05-29 MED ORDER — PERMETHRIN 5 % EX CREA
TOPICAL_CREAM | Freq: Once | CUTANEOUS | Status: DC
  Filled 2017-05-29: qty 60

## 2017-05-29 MED ORDER — IVERMECTIN 3 MG PO TABS
200.0000 ug/kg | ORAL_TABLET | Freq: Once | ORAL | Status: AC
  Administered 2017-05-29: 22500 ug via ORAL
  Filled 2017-05-29: qty 8

## 2017-05-29 MED ORDER — PERMETHRIN 5 % EX CREA: 1.0000 "application " | TOPICAL_CREAM | Freq: Once | CUTANEOUS | 0 refills | Status: DC

## 2017-05-29 MED ORDER — IVERMECTIN 3 MG PO TABS: 200.0000 ug/kg | ORAL_TABLET | Freq: Once | ORAL | 0 refills | Status: AC

## 2017-05-29 MED ORDER — DIPHENHYDRAMINE HCL 25 MG PO CAPS
25.0000 mg | ORAL_CAPSULE | Freq: Once | ORAL | Status: AC
  Administered 2017-05-29: 25 mg via ORAL
  Filled 2017-05-29: qty 1

## 2017-05-29 NOTE — ED Triage Notes (Addendum)
Patient ambulatory to triage with steady gait, without difficulty or distress noted; pt reports that she is here for scabies/itching; st did not get the rx filled because is was $99; pt has been seen multiple times for same

## 2017-05-29 NOTE — ED Notes (Signed)
States she thinks she needs antibiotics  And she just doesn't feel right

## 2017-05-29 NOTE — ED Triage Notes (Signed)
States she is here for scabies   Was seen several times for same  Was given po med dose for scabies treatment here in the ED  Since has been seen several times the last time she was seen at [facility]  States she was not able to get rx's filled

## 2017-05-29 NOTE — Discharge Instructions (Signed)
Take prescriptions as prescribed. Ivermectin prescription should be repeated on 06/05/2017.   Keep wound along the right medial aspect of her ankle clean and dry and covered. Follow up with your primary care provider for continued care or preferably a wound care clinic.

## 2017-05-29 NOTE — ED Provider Notes (Signed)
[facility] Emergency Department Provider Note   ____________________________________________   I have reviewed the triage vital signs and the nursing notes.   HISTORY  Chief Complaint Rash    HPI Lori Pope is a 41 y.o. female emergency department with complaints of ongoing scabies and diabetic ulcer along the right medial malleoli. Patient is requesting antibiotics and "treatment cream" for the scabies. Patient has been evaluated in this department several times and at the [facility] ED for the same symptoms since May 02, 2017. Patient states she has recently returned from [place] and "needs to be treated". She was unable to have past prescriptions filled because she did not have the financial resources. Patient states she will be going home with her sister when discharged today. Patient reports persistent itching along the torso and bilateral upper extremities, in addition, she feels she needs antibiotics for the wound on the right ankle. Visualization of the right ankle wound reveals no signs of current infection. Patient appeared very sleepy, she reports not sleeping much since returning from [place]. Patient denies fever, chills, headache, vision changes, chest pain, chest tightness, shortness of breath, abdominal pain, nausea and vomiting.  Past Medical History:  Diagnosis Date  . Diabetes mellitus without complication (HCC)   . Hypertension     There are no active problems to display for this patient.   History reviewed. No pertinent surgical history.  Prior to Admission medications   Medication Sig Start Date End Date Taking? Authorizing Provider  diphenhydrAMINE (BENADRYL) 25 mg capsule Take 25 mg by mouth every 6 (six) hours as needed.    [provider]  ivermectin (STROMECTOL) 3 MG TABS tablet Take 7.5 tablets (22,500 mcg total) by mouth once. 06/05/17 06/05/17  Oddie Bottger M, PA-C  permethrin (ELIMITE) 5 % cream Apply 1  application topically once. 06/12/17 06/12/17  Chandler Stofer M, PA-C    Allergies Clindamycin/lincomycin and Vancomycin  No family history on file.  Social History Social History  Substance Use Topics  . Smoking status: Never Smoker  . Smokeless tobacco: Never Used  . Alcohol use No   Review of Systems  Constitutional: No fever/chills Eyes: No visual changes. No discharge ENT: No upper respiratory complaints. Cardiovascular: no chest pain. Respiratory: no cough. No SOB. Gastrointestinal: No abdominal pain.  No nausea, vomiting or diarrhea.  No constipation. Genitourinary: Negative for dysuria. No hematuria Musculoskeletal: Negative for musculoskeletal pain. Skin: Positive for scabies, bedbugs, diabetic foot ulcer along the medial malleoli of the right foot.  Neurological: Negative for headaches, focal weakness or numbness. ____________________________________________   PHYSICAL EXAM:  VITAL SIGNS: ED Triage Vitals  Enc Vitals Group     BP 05/29/17 1334 (!) 162/72     Pulse Rate 05/29/17 1334 (!) 120     Resp 05/29/17 1334 16     Temp 05/29/17 1334 98 F (36.7 C)     Temp Source 05/29/17 1334 Axillary     SpO2 05/29/17 1334 99 %     Weight 05/29/17 1321 244 lb (110.7 kg)     Height 05/29/17 1321 5\' 7"  (1.702 m)     Head Circumference --      Peak Flow --      Pain Score --      Pain Loc --      Pain Edu? --      Excl. in GC? --     Constitutional: Alert and oriented. Well appearing and in no acute distress. Eyes: Conjunctivae are normal.  PERRL. EOMI. Head: Atraumatic/Normocephalic ENT:      Nose: No congestion/rhinnorhea.      Mouth/Throat: Mucous membranes are moist.  Neck: supple Hematological/Lymphatic/Immunilogical: No cervical lymphadenopathy. Cardiovascular: Normal rate, regular rhythm. Good peripheral circulation. Respiratory: Normal respiratory effort without tachypnea or retractions. Lungs CTAB. Musculoskeletal: Full range of motion to all  extremities. Diabetic foot ulcer along the medial malleoli right ankle. Wound bed based red and white with small amount of budding epithelization however dry. No drainage noted. Wound margins slightly invaginated.  Neurologic:  Normal speech and language. No gross focal neurologic deficits are appreciated.  Skin:  Skin is warm, dry and intact. Scabies with linear excoriations from scabies. Multiple erythematous lesions consistent with bedbug bites. Psychiatric: Mood and affect are normal. Speech and behavior are normal. Patient exhibits appropriate insight and judgement. ____________________________________________   LABS (all labs ordered are listed, but only abnormal results are displayed)  Labs Reviewed - No data to display ____________________________________________  EKG none ____________________________________________  RADIOLOGY none ____________________________________________   PROCEDURES  Procedure(s) performed: no    Critical Care performed: no ____________________________________________   INITIAL IMPRESSION / ASSESSMENT AND PLAN / ED COURSE  Pertinent labs & imaging results that were available during my care of the patient were reviewed by me and considered in my medical decision making (see chart for details).  Patient presented emergency department with continued scabies, bedbugs and chronic diabetic ulcer along the medial malleoli of the right foot. History and physical exam are reassuring symptoms are consistent with scabies, bedbug bites, and non-infected diabetic foot ulcer. Patient given dose of ivermectin and Benadryl during course of care in the emergency department. Patient will be prescribed second dose of ivermectin and a prescription for permethrin cream. Patient is given ED precautions to return to the ED for any worsening or new symptoms. Patient was advised to follow up with primary care provider as needed.        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    Clois ComberLittle, Nollie Terlizzi M, PA-C 05/29/17 1739    Minna AntisPaduchowski, Kevin, MD 05/29/17 2322

## 2017-05-30 ENCOUNTER — Emergency Department
Admission: EM | Admit: 2017-05-30 | Discharge: 2017-05-30 | Disposition: A | Discharge status: Home / Self Care | Payer: Medicare Other | Attending: Emergency Medicine | Admitting: Emergency Medicine

## 2017-05-30 DIAGNOSIS — B86 Scabies: Secondary | ICD-10-CM

## 2017-05-30 NOTE — ED Provider Notes (Signed)
[facility] Emergency Department Provider Note   Time seen: 11:00 PM  I have reviewed the triage vital signs and the nursing notes.   HISTORY  Chief Complaint Rash    HPI Lori Pope is a 41 y.o. female with below list of chronic medical conditions including being diagnosed today with scabies returns to the emergency department stating that she is unable to fill the prescription for Permethrin secondary to cost.  Past Medical History:  Diagnosis Date  . Diabetes mellitus without complication (HCC)   . Hypertension     There are no active problems to display for this patient.   History reviewed. No pertinent surgical history.  Prior to Admission medications   Medication Sig Start Date End Date Taking? Authorizing Provider  diphenhydrAMINE (BENADRYL) 25 mg capsule Take 25 mg by mouth every 6 (six) hours as needed.    [provider]  ivermectin (STROMECTOL) 3 MG TABS tablet Take 7.5 tablets (22,500 mcg total) by mouth once. 06/05/17 06/05/17  Little, Traci M, PA-C  permethrin (ELIMITE) 5 % cream Apply 1 application topically once. 06/12/17 06/12/17  Little, Traci M, PA-C    Allergies Clindamycin/lincomycin and Vancomycin  No family history on file.  Social History Social History  Substance Use Topics  . Smoking status: Never Smoker  . Smokeless tobacco: Never Used  . Alcohol use No    Review of Systems Constitutional: No fever/chills Eyes: No visual changes. ENT: No sore throat. Cardiovascular: Denies chest pain. Respiratory: Denies shortness of breath. Gastrointestinal: No abdominal pain.  No nausea, no vomiting.  No diarrhea.  No constipation. Genitourinary: Negative for dysuria. Musculoskeletal: Negative for neck pain.  Negative for back pain. Integumentary: Positive for rash. Neurological: Negative for headaches, focal weakness or numbness.   ____________________________________________   PHYSICAL EXAM:  VITAL  SIGNS: ED Triage Vitals  Enc Vitals Group     BP 05/29/17 2323 (!) 158/100     Pulse Rate 05/29/17 2323 100     Resp 05/29/17 2323 18     Temp 05/29/17 2323 98 F (36.7 C)     Temp Source 05/29/17 2321 Oral     SpO2 05/29/17 2323 99 %     Weight 05/29/17 2321 110.7 kg (244 lb)     Height 05/29/17 2321 1.702 m (5\' 7" )     Head Circumference --      Peak Flow --      Pain Score --      Pain Loc --      Pain Edu? --      Excl. in GC? --     Constitutional: Alert and oriented. Well appearing and in no acute distress. Eyes: Conjunctivae are normal.  Head: Atraumatic. Mouth/Throat: Mucous membranes are moist. Neck: No stridor.   Cardiovascular: Normal rate, regular rhythm. Good peripheral circulation. Grossly normal heart sounds. Respiratory: Normal respiratory effort.  No retractions. Lungs CTAB. Gastrointestinal: Soft and nontender. No distention.  Musculoskeletal: No lower extremity tenderness nor edema. No gross deformities of extremities. Neurologic:  Normal speech and language. No gross focal neurologic deficits are appreciated.  Skin:  Generalized maculopapular rash with areas of excoriation consistent with scabies     Procedures   ____________________________________________   INITIAL IMPRESSION / ASSESSMENT AND PLAN / ED COURSE  Pertinent labs & imaging results that were available during my care of the patient were reviewed by me and considered in my medical decision making (see chart for details).  Patient given Permethrin in the emergency  department.    ____________________________________________  FINAL CLINICAL IMPRESSION(S) / ED DIAGNOSES  Final diagnoses:  Scabies     MEDICATIONS GIVEN DURING THIS VISIT:  Medications  permethrin (ELIMITE) 5 % cream (not administered)     NEW OUTPATIENT MEDICATIONS STARTED DURING THIS VISIT:  New Prescriptions   No medications on file    Modified Medications   No medications on file    Discontinued  Medications   No medications on file     Note:  This document was prepared using Dragon voice recognition software and may include unintentional dictation errors.    Darci CurrentBrown, [place] N, MD 05/30/17 [id number]

## 2017-05-31 ENCOUNTER — Encounter: Payer: Self-pay | Admitting: Emergency Medicine

## 2017-05-31 ENCOUNTER — Emergency Department
Admission: EM | Admit: 2017-05-31 | Discharge: 2017-05-31 | Disposition: A | Discharge status: Home / Self Care | Payer: Medicare Other | Attending: Emergency Medicine | Admitting: Emergency Medicine

## 2017-05-31 DIAGNOSIS — B86 Scabies: Secondary | ICD-10-CM | POA: Insufficient documentation

## 2017-05-31 DIAGNOSIS — E119 Type 2 diabetes mellitus without complications: Secondary | ICD-10-CM | POA: Insufficient documentation

## 2017-05-31 DIAGNOSIS — R21 Rash and other nonspecific skin eruption: Secondary | ICD-10-CM | POA: Diagnosis present

## 2017-05-31 DIAGNOSIS — I10 Essential (primary) hypertension: Secondary | ICD-10-CM | POA: Diagnosis not present

## 2017-05-31 MED ORDER — DIPHENHYDRAMINE HCL 25 MG PO CAPS
25.0000 mg | ORAL_CAPSULE | ORAL | Status: AC
Start: 2017-05-31 — End: 2017-05-31
  Administered 2017-05-31: 25 mg via ORAL
  Filled 2017-05-31: qty 1

## 2017-05-31 MED ORDER — PERMETHRIN 5 % EX CREA
TOPICAL_CREAM | Freq: Once | CUTANEOUS | Status: AC
  Administered 2017-05-31: 14:00:00 via TOPICAL
  Filled 2017-05-31: qty 60

## 2017-05-31 NOTE — Care Management Note (Signed)
Case Management Note  Patient Details  Name: Lori Pope MRN: [id number] Date of Birth: 12/21/1975  Subjective/Objective: Spoke to patient per Rn. I talked to her about her living situation and pt. Says she has no situation. She states she lost her appartment in [place] because it had scabies. And that her sister does not want her to stay with her due the scabies also. When asked if she   Would like to be referred to the homeless shelter, she says "those people smell, and they have scabies. I don't want more". I have explained to her that we will start her treatment here, and then could refer her, but she has asked me to ," mind my own business and never mind about where she goes".    I tried to offer the CSW and she has declined saying she will figure it out. I have reported to the nurse and the MD the conversation.             Action/Plan:   Expected Discharge Date:                  Expected Discharge Plan:     In-House Referral:     Discharge planning Services     Post Acute Care Choice:    Choice offered to:     DME Arranged:    DME Agency:     HH Arranged:    HH Agency:     Status of Service:     If discussed at [employer] Length of Stay Meetings, dates discussed:    Additional Comments:  Berna BueCheryl Harry Shuck, RN 05/31/2017, 1:42 PM

## 2017-05-31 NOTE — Progress Notes (Signed)
CSW engaged with Patient at her bedside regarding her request that CSW contact Department [employer] regarding her monthly check. CSW contacted them at her request, however, Department [employer] was unable to provide this CSW with any information and requested that the Patient give them a call. CSW informed the Patient of this information and provided her with the number to call. CSW also provided Patient with information for local group homes and family care homes. Patient reports that she obtained an FL-2 from her daughter. Patient denied any further questions. CSW has signed off. Please contact should new need(s) arise.    Enos FlingAshley Koen Antilla, MSW, LCSW [facility] Clinical Social Worker [phone]

## 2017-05-31 NOTE — ED Notes (Signed)
Consulted with case management for additional social services.

## 2017-05-31 NOTE — ED Provider Notes (Signed)
[facility] Emergency Department Provider Note   ____________________________________________   First MD Initiated Contact with Patient 05/31/17 1327     (approximate)  I have reviewed the triage vital signs and the nursing notes.   HISTORY  Chief Complaint Pruritis and Rash (questionable scabies)    HPI Coralee Rudonya Lesesne is a 41 y.o. female reports ongoing rash and itch  Patient reports he's had an itchy rash for several months. She has tried treating it, but reports it does not seem to getting better. She reports she has scabies  She reports she took "the pill" for a few days ago, and also had cream applied yesterday.  No fevers chills. No nausea or vomiting. Reports the same rash itching and covering her body including over her abdomen and back were she itches a lot.   Past Medical History:  Diagnosis Date  . Diabetes mellitus without complication (HCC)   . Hypertension     There are no active problems to display for this patient.   History reviewed. No pertinent surgical history.  Prior to Admission medications   Medication Sig Start Date End Date Taking? Authorizing Provider  diphenhydrAMINE (BENADRYL) 25 mg capsule Take 25 mg by mouth every 6 (six) hours as needed.    [provider]  ivermectin (STROMECTOL) 3 MG TABS tablet Take 7.5 tablets (22,500 mcg total) by mouth once. 06/05/17 06/05/17  Little, Traci M, PA-C  permethrin (ELIMITE) 5 % cream Apply 1 application topically once. 06/12/17 06/12/17  Little, Traci M, PA-C    Allergies Clindamycin/lincomycin and Vancomycin  History reviewed. No pertinent family history.  Social History Social History  Substance Use Topics  . Smoking status: Never Smoker  . Smokeless tobacco: Never Used  . Alcohol use No    Review of Systems Constitutional: No fever/chills Cardiovascular: Denies chest pain. Gastrointestinal: No abdominal pain.  No nausea, no vomiting.   Musculoskeletal:  Negative for back pain.Order back and abdomen itch from the rash. Skin: Negative for rash.    ____________________________________________   PHYSICAL EXAM:  VITAL SIGNS: ED Triage Vitals  Enc Vitals Group     BP 05/31/17 1302 (!) 143/70     Pulse Rate 05/31/17 1302 (!) 103     Resp 05/31/17 1302 19     Temp 05/31/17 1302 98.3 F (36.8 C)     Temp Source 05/31/17 1302 Oral     SpO2 05/31/17 1302 100 %     Weight 05/31/17 1253 244 lb (110.7 kg)     Height 05/31/17 1253 5\' 7"  (1.702 m)     Head Circumference --      Peak Flow --      Pain Score 05/31/17 1248 0     Pain Loc --      Pain Edu? --      Excl. in GC? --     Constitutional: Alert and oriented. Well appearing and in no acute distress. Eyes: Conjunctivae are normal. Head: Atraumatic. Nose: No congestion/rhinnorhea. Mouth/Throat: Mucous membranes are moist. Neck: No stridor.   Cardiovascular: Normal rate, regular rhythm.  Respiratory: Normal respiratory effort.  No retractions. Speaks in full clear sentences. Gastrointestinal: Soft and nontender. No distention. Musculoskeletal: No lower extremity tenderness nor edema. Neurologic:  Normal speech and language. No gross focal neurologic deficits are appreciated.  Skin:  Skin is warm, dry and intact. Skin daily linear slightly raised patches of rash and tract marks noted with excoriations and multiple areas particularly focused over the abdomen lower legs  and back. Consistent with the rash of scabies. No weeping or open rashes. She does have a dry healing ulcer over the right medial ankle which shows no evidence of superinfection is about 2 x 2 centimeters and superficial Psychiatric: Mood and affect are normal. Speech and behavior are normal.  ____________________________________________   LABS (all labs ordered are listed, but only abnormal results are displayed)  Labs Reviewed - No data to  display ____________________________________________  EKG   ____________________________________________  RADIOLOGY   ____________________________________________   PROCEDURES  Procedure(s) performed: None  Procedures  Critical Care performed: No  ____________________________________________   INITIAL IMPRESSION / ASSESSMENT AND PLAN / ED COURSE  Pertinent labs & imaging results that were available during my care of the patient were reviewed by me and considered in my medical decision making (see chart for details).  Patient presents for itchy rash or pruritus. No rash to see the prescription or his of scabies without evidence of overlying superinfection. The patient has had significant noncompliance and I suspect social stressors causing some of her noncompliance. She was offered social work and case management evaluation, but refused further evaluation by them stating she did not need help. She refuses further social work assistance or aid. I will reapply permethrin here, she was recently treated with invermectin, I don't believe for repeat dosing is necessary at this time frame.  Patient was encouraged to allow additional assistance including social and case worker consultation but again refuses.  We'll reapply cream and discharge the patient is in hopes that she will change her mind and allow us to further assist her in gaining social assistance.         ____________________________________________   FINAL CLINICAL IMPRESSION(S) / ED DIAGNOSES  Final diagnoses:  Scabies infestation      NEW MEDICATIONS STARTED DURING THIS VISIT:  New Prescriptions   No medications on file     Note:  This document was prepared using Dragon voice recognition software and may include unintentional dictation errors.     Sharyn CreamerQuale, Mark, MD 05/31/17 1353

## 2017-05-31 NOTE — ED Notes (Addendum)
Social work at bedside for consultation with patient regarding housing prior to discharge. Pt also received bus pass

## 2017-05-31 NOTE — ED Triage Notes (Signed)
Pt presents to ED c/o increased pruritis with spread of scabies. Pt states she was seen here 2 days ago and was given cream but feels like symptoms have worsened.

## 2017-06-02 ENCOUNTER — Emergency Department
Admission: EM | Admit: 2017-06-02 | Discharge: 2017-06-02 | Disposition: A | Discharge status: Home / Self Care | Payer: Medicare Other | Attending: Emergency Medicine | Admitting: Emergency Medicine

## 2017-06-02 ENCOUNTER — Encounter: Payer: Self-pay | Admitting: Emergency Medicine

## 2017-06-02 DIAGNOSIS — R21 Rash and other nonspecific skin eruption: Secondary | ICD-10-CM | POA: Diagnosis present

## 2017-06-02 DIAGNOSIS — E119 Type 2 diabetes mellitus without complications: Secondary | ICD-10-CM | POA: Diagnosis not present

## 2017-06-02 DIAGNOSIS — B86 Scabies: Secondary | ICD-10-CM

## 2017-06-02 DIAGNOSIS — I10 Essential (primary) hypertension: Secondary | ICD-10-CM | POA: Insufficient documentation

## 2017-06-02 MED ORDER — PERMETHRIN 5 % EX CREA: TOPICAL_CREAM | CUTANEOUS | 2 refills | Status: AC

## 2017-06-02 MED ORDER — HYDROXYZINE HCL 25 MG PO TABS: 25.0000 mg | ORAL_TABLET | Freq: Three times a day (TID) | ORAL | 0 refills | Status: AC | PRN

## 2017-06-02 NOTE — ED Triage Notes (Signed)
Patient arrives to ED via POV from home due to scabies. Patient has been here multiple times for same. Even and non labored respirations noted.

## 2017-06-02 NOTE — ED Notes (Signed)
After extensive conversation (approx 20 min) with pt about treatment for person and belonging for scabies, pt refused to get VS or sign for discharge, Pt did have cotton swabs (was cleaning ears) and towel from cabinet, Pt declined opportunity for further questions, left with prescriptions and belongings

## 2017-06-02 NOTE — ED Provider Notes (Signed)
[facility]  Time seen: 6:48 PM  I have reviewed the triage vital signs and the nursing notes.   HISTORY  Chief Complaint Other    HPI Lori Pope is a 41 y.o. female with a past medical history of diabetes, hypertension, presents to the emergency department with continued rash and itching. According to the patient for the past 6 months to years she has had a rash with itching. The rash is very consistent with scabies. Patient states she has been treated multiple times for scabies. She states she is not getting better and the treatments were not working she wants to be admitted to the hospital so she can be treated in the hospital. Denies any fever. Review of systems is otherwise negative. Denies cough, congestion, abdominal pain.  Past Medical History:  Diagnosis Date  . Diabetes mellitus without complication (HCC)   . Hypertension     There are no active problems to display for this patient.   History reviewed. No pertinent surgical history.  Prior to Admission medications   Medication Sig Start Date End Date Taking? Authorizing Provider  diphenhydrAMINE (BENADRYL) 25 mg capsule Take 25 mg by mouth every 6 (six) hours as needed.    [provider]  ivermectin (STROMECTOL) 3 MG TABS tablet Take 7.5 tablets (22,500 mcg total) by mouth once. 06/05/17 06/05/17  Little, Traci M, PA-C  permethrin (ELIMITE) 5 % cream Apply 1 application topically once. 06/12/17 06/12/17  Little, Jordan Likesraci M, PA-C    Allergies  Allergen Reactions  . Clindamycin/Lincomycin Nausea Only  . Vancomycin Rash    No family history on file.  Social History Social History  Substance Use Topics  . Smoking status: Never Smoker  . Smokeless tobacco: Never Used  . Alcohol use No    Review of Systems Constitutional: Negative for fever. Cardiovascular: Negative for chest pain. Respiratory: Negative for shortness of breath. Gastrointestinal:  Negative for abdominal pain Skin: Positive for rash. Positive for itching. Neurological: Negative for headache All other ROS negative  ____________________________________________   PHYSICAL EXAM:  VITAL SIGNS: ED Triage Vitals [06/02/17 1821]  Enc Vitals Group     BP      Pulse      Resp      Temp      Temp src      SpO2      Weight 244 lb (110.7 kg)     Height 5\' 5"  (1.651 m)     Head Circumference      Peak Flow      Pain Score      Pain Loc      Pain Edu?      Excl. in GC?     Constitutional: Alert and oriented. Well appearing and in no distress. Frequent itching Eyes: Normal exam ENT   Head: Normocephalic and atraumatic.   Mouth/Throat: Mucous membranes are moist. Cardiovascular: Normal rate, regular rhythm. Respiratory: Normal respiratory effort without tachypnea nor retractions. Breath sounds are clear Gastrointestinal: Soft and nontender. No distention.   Musculoskeletal: Nontender with normal range of motion in all extremities. Neurologic:  Normal speech and language. No gross focal neurologic deficits Skin:  Patient with rash most consistent with scabies. Very pruritic. Psychiatric: Mood and affect are normal.   ____________________________________________    INITIAL IMPRESSION / ASSESSMENT AND PLAN / ED COURSE  Pertinent labs & imaging results that were available during my care of the patient were reviewed by me and considered in my  medical decision making (see chart for details).  Patient presents to the emergency department with diffuse rash very pruritic. Rashes consistent with scabies. I had a very long discussion with the patient regarding the need for her to follow up with a primary care doctor for ongoing treatment. Patient is currently living with her sister. I discussed with the patient the proper treatment of scabies including applying permethrin cream to entire body waiting 12-16 hours and then washing off. I also discussed while the  permethrin cream as on the patient she needs to take all of her clothing off her bedding washed everything in hot water and dry in hot air.  I discussed everything that cannot be washings to be placed into trash bags and not touched for over 7 days. I discussed with the patient is extremely important that she follow these other precautions, she was unaware of these precautions previously. Sister states she was unaware of these precautions as well. I suspect the patient has been reinfecting herself as she has not been washing her clothing or bedding. We will discharge with permethrin and hydroxyzine.   ____________________________________________   FINAL CLINICAL IMPRESSION(S) / ED DIAGNOSES  Scabies    Minna AntisPaduchowski, Olando Willems, MD 06/02/17 [phone]

## 2017-06-02 NOTE — Discharge Instructions (Signed)
As we discussed please call the number to arrange an appointment with a primary care doctor. We need a doctor to follow-up with for continued management of your condition. As an emergency department we are not able to offer continuity of care and continued management of your condition. Please return to the emergency department for any emergent symptoms or personally concerning symptoms, otherwise please follow-up with a doctor by calling the number provided.

## 2017-06-02 NOTE — ED Notes (Signed)
Dr. Lenard LancePaduchowski at bedside speaking to patient.

## 2017-06-04 ENCOUNTER — Encounter: Payer: Self-pay | Admitting: Emergency Medicine

## 2017-06-04 ENCOUNTER — Emergency Department
Admission: EM | Admit: 2017-06-04 | Discharge: 2017-06-04 | Disposition: A | Discharge status: Home / Self Care | Payer: Medicare Other | Attending: Emergency Medicine | Admitting: Emergency Medicine

## 2017-06-04 DIAGNOSIS — B86 Scabies: Secondary | ICD-10-CM | POA: Diagnosis not present

## 2017-06-04 DIAGNOSIS — E119 Type 2 diabetes mellitus without complications: Secondary | ICD-10-CM | POA: Insufficient documentation

## 2017-06-04 DIAGNOSIS — I10 Essential (primary) hypertension: Secondary | ICD-10-CM | POA: Insufficient documentation

## 2017-06-04 NOTE — ED Triage Notes (Signed)
Pt here for ongoing issue with scabies.  Pt seen multiple times for the same, states she does not have any medications. Pt instructed to stand in lobby near POD D to prevent further transmission to others.

## 2017-06-04 NOTE — ED Notes (Addendum)
Pt seen in lobby by Christiane HaJonathan PA and discussed treatment plan and about repeated visits to the ED. Pt was escorted out of the lobby by officer.  Christiane HaJonathan PA verbally discussed discharge and follow up procedures with PCP.
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
## 2017-10-04 ENCOUNTER — Encounter: Payer: Self-pay | Admitting: Emergency Medicine

## 2017-10-04 ENCOUNTER — Emergency Department
Admission: EM | Admit: 2017-10-04 | Discharge: 2017-10-04 | Disposition: A | Discharge status: Home / Self Care | Payer: Medicare Other | Attending: Emergency Medicine | Admitting: Emergency Medicine

## 2017-10-04 ENCOUNTER — Emergency Department
Admission: EM | Admit: 2017-10-04 | Discharge: 2017-10-04 | Disposition: A | Discharge status: Home / Self Care | Payer: Medicare Other | Source: Home / Self Care | Attending: Emergency Medicine | Admitting: Emergency Medicine

## 2017-10-04 DIAGNOSIS — X58XXXA Exposure to other specified factors, initial encounter: Secondary | ICD-10-CM | POA: Insufficient documentation

## 2017-10-04 DIAGNOSIS — S81801A Unspecified open wound, right lower leg, initial encounter: Secondary | ICD-10-CM

## 2017-10-04 DIAGNOSIS — E119 Type 2 diabetes mellitus without complications: Secondary | ICD-10-CM | POA: Insufficient documentation

## 2017-10-04 DIAGNOSIS — Y999 Unspecified external cause status: Secondary | ICD-10-CM | POA: Insufficient documentation

## 2017-10-04 DIAGNOSIS — R1110 Vomiting, unspecified: Secondary | ICD-10-CM | POA: Insufficient documentation

## 2017-10-04 DIAGNOSIS — Y939 Activity, unspecified: Secondary | ICD-10-CM | POA: Diagnosis not present

## 2017-10-04 DIAGNOSIS — S8981XA Other specified injuries of right lower leg, initial encounter: Secondary | ICD-10-CM | POA: Diagnosis not present

## 2017-10-04 DIAGNOSIS — B86 Scabies: Secondary | ICD-10-CM | POA: Diagnosis not present

## 2017-10-04 DIAGNOSIS — R2241 Localized swelling, mass and lump, right lower limb: Secondary | ICD-10-CM | POA: Diagnosis present

## 2017-10-04 DIAGNOSIS — Z79899 Other long term (current) drug therapy: Secondary | ICD-10-CM | POA: Insufficient documentation

## 2017-10-04 DIAGNOSIS — I10 Essential (primary) hypertension: Secondary | ICD-10-CM | POA: Insufficient documentation

## 2017-10-04 DIAGNOSIS — Y929 Unspecified place or not applicable: Secondary | ICD-10-CM | POA: Insufficient documentation

## 2017-10-04 LAB — URINE DRUG SCREEN, QUALITATIVE (ARMC ONLY)
Amphetamines, Ur Screen: NOT DETECTED
BARBITURATES, UR SCREEN: NOT DETECTED
Benzodiazepine, Ur Scrn: NOT DETECTED
CANNABINOID 50 NG, UR ~~LOC~~: NOT DETECTED
Cocaine Metabolite,Ur ~~LOC~~: NOT DETECTED
MDMA (Ecstasy)Ur Screen: NOT DETECTED
Methadone Scn, Ur: NOT DETECTED
Opiate, Ur Screen: NOT DETECTED
Phencyclidine (PCP) Ur S: NOT DETECTED
TRICYCLIC, UR SCREEN: NOT DETECTED

## 2017-10-04 MED ORDER — PERMETHRIN 5 % EX CREA
TOPICAL_CREAM | Freq: Once | CUTANEOUS | Status: AC
  Administered 2017-10-04: 06:00:00 via TOPICAL
  Filled 2017-10-04: qty 60

## 2017-10-04 NOTE — ED Triage Notes (Signed)
Pt was discharged last night with scabies and sent to lobby, pt now vomiting in lobby. PT states she wants to be seen again, pt in NAD at this time.

## 2017-10-04 NOTE — ED Provider Notes (Signed)
[facility] Emergency Department Provider Note    ____________________________________________   I have reviewed the triage vital signs and the nursing notes.   HISTORY  Chief Complaint Emesis   History limited by: Not Limited   HPI Lori Pope is a 41 y.o. female who presents to the emergency department today because of emesis and concern for thyroid acting up.  DURATION:started this morning  TIMING: started suddenly just after discharge from this ED for scabies SEVERITY: moderate QUALITY: non bloody CONTEXT: patient with history of hyperthyroid. States she has not had her medication for weeks. MODIFYING FACTORS: none ASSOCIATED SYMPTOMS: had some diarrhea.  Per medical record review patient has a history of hyperthyroid. [facility]. History of non compliance. Had prescription called in to pharmacy 2 days ago. Has appointment with endocrinologist today.  Past Medical History:  Diagnosis Date  . Diabetes mellitus without complication (HCC)   . Hypertension     There are no active problems to display for this patient.   History reviewed. No pertinent surgical history.  Prior to Admission medications   Medication Sig Start Date End Date Taking? Authorizing Provider  diphenhydrAMINE (BENADRYL) 25 mg capsule Take 25 mg by mouth every 6 (six) hours as needed.    [provider]  hydrOXYzine (ATARAX/VISTARIL) 25 MG tablet Take 1 tablet (25 mg total) by mouth 3 (three) times daily as needed. 06/02/17   Minna AntisPaduchowski, Kevin, MD  methimazole (TAPAZOLE) 10 MG tablet Take 30 mg by mouth 2 (two) times daily.    [provider]  permethrin (ELIMITE) 5 % cream Apply to entire body, leave 12-16 hours then wash off.  Reapply in 7 days. Patient not taking: Reported on 10/04/2017 06/02/17 06/02/18  Minna AntisPaduchowski, Kevin, MD  propranolol ER (INDERAL LA) 60 MG 24 hr capsule Take 60 mg by mouth daily.    [provider]     Allergies Clindamycin/lincomycin and Vancomycin  No family history on file.  Social History Social History   Tobacco Use  . Smoking status: Never Smoker  . Smokeless tobacco: Never Used  Substance Use Topics  . Alcohol use: No  . Drug use: No    Review of Systems Constitutional: No fever/chills Eyes: No visual changes. ENT: No sore throat. Cardiovascular: Denies chest pain. Respiratory: Denies shortness of breath. Gastrointestinal: Positive for nausea and vomiting.  Genitourinary: Negative for dysuria. Musculoskeletal: Negative for back pain. Skin: Positive for itchiness. Neurological: Negative for headaches, focal weakness or numbness.  ____________________________________________   PHYSICAL EXAM:  VITAL SIGNS: ED Triage Vitals  Enc Vitals Group     BP 10/04/17 0743 (!) 150/57     Pulse Rate 10/04/17 0740 98     Resp 10/04/17 0740 16     Temp 10/04/17 0740 98.4 F (36.9 C)     Temp Source 10/04/17 0740 Oral     SpO2 10/04/17 0740 98 %     Weight --      Height --      Head Circumference --      Peak Flow --      Pain Score 10/04/17 0740 0   Constitutional: Alert and oriented. Well appearing and in no distress. No agitation. Eyes: Conjunctivae are normal.  ENT   Head: Normocephalic and atraumatic.   Nose: No congestion/rhinnorhea.   Mouth/Throat: Mucous membranes are moist.   Neck: No stridor. Hematological/Lymphatic/Immunilogical: No cervical lymphadenopathy. Cardiovascular: Normal rate, regular rhythm.  No murmurs, rubs, or gallops.  Respiratory: Normal respiratory effort  without tachypnea nor retractions. Breath sounds are clear and equal bilaterally. No wheezes/rales/rhonchi. Gastrointestinal: Soft and non tender. No rebound. No guarding.  Genitourinary: Deferred Musculoskeletal: Normal range of motion in all extremities. No lower extremity edema. Neurologic:  Normal speech and language. No gross focal neurologic deficits are  appreciated.  Skin:  Diffuse scabie like lesions. Psychiatric: Mood and affect are normal. Speech and behavior are normal. Patient exhibits appropriate insight and judgment.  ____________________________________________    LABS (pertinent positives/negatives)  None  ____________________________________________   EKG  None  ____________________________________________    RADIOLOGY  None  ____________________________________________   PROCEDURES  Procedures  ____________________________________________   INITIAL IMPRESSION / ASSESSMENT AND PLAN / ED COURSE  Pertinent labs & imaging results that were available during my care of the patient were reviewed by me and considered in my medical decision making (see chart for details).  Patient presents because of concern for emesis and thyroid "acting up". On exam patient is calm. No agitation. Mildly hypertensive. Wartofsky score of 15 (temp <99, CNS effects absent, GI/hepatic dysfunction moderate, heart beats 90-109, CHF absent, afib no, precipitating event no). Patient has appointment with her endocrinologist today. Given presentation at this time do not think patient in thyroid storm or thyrotoxicosis. Think best thing would be for patient to follow up with her endocrinologist today. Patient states she has not picked up her prescription that was called in two days ago. Discussed with the patient the importance of getting her medication filled.   ____________________________________________   FINAL CLINICAL IMPRESSION(S) / ED DIAGNOSES  Final diagnoses:  Vomiting, intractability of vomiting not specified, presence of nausea not specified, unspecified vomiting type     Note: This dictation was prepared with Dragon dictation. Any transcriptional errors that result from this process are unintentional     Phineas SemenGoodman, Mariem Skolnick, MD 10/04/17 [phone]

## 2017-10-04 NOTE — ED Notes (Signed)
Pt walked out to lobby after requesting W/C. Pt brought back to room and asked to wait until ride comes. Pt refused to wait stating that she would wait outside. Pt told that it was cold outside but she is waiting outside for cab at this time.

## 2017-10-04 NOTE — ED Provider Notes (Signed)
[facility] Emergency Department Provider Note    First MD Initiated Contact with Patient 10/04/17 0250     (approximate)  I have reviewed the triage vital signs and the nursing notes.   HISTORY  Chief Complaint Sore   HPI Lori Pope is a 41 y.o. female with below list of chronic medical conditions presents to the emergency department via EMS with complaint of sore to the right ankle as well as possible "bedbugs".  Patient admits to generalized pruritus.  Patient states that she has had a wound on her left ankle for "a very long time" however she is concerned about possible infection at the wound site at this time.  Patient denies any drainage no fever.   Past Medical History:  Diagnosis Date  . Diabetes mellitus without complication (HCC)   . Hypertension     There are no active problems to display for this patient.   Past surgical history None   Prior to Admission medications   Medication Sig Start Date End Date Taking? Authorizing Provider  diphenhydrAMINE (BENADRYL) 25 mg capsule Take 25 mg by mouth every 6 (six) hours as needed.   Yes [provider]  hydrOXYzine (ATARAX/VISTARIL) 25 MG tablet Take 1 tablet (25 mg total) by mouth 3 (three) times daily as needed. 06/02/17  Yes Minna AntisPaduchowski, Kevin, MD  methimazole (TAPAZOLE) 10 MG tablet Take 30 mg by mouth 2 (two) times daily.   Yes [provider]  propranolol ER (INDERAL LA) 60 MG 24 hr capsule Take 60 mg by mouth daily.   Yes [provider]  permethrin (ELIMITE) 5 % cream Apply to entire body, leave 12-16 hours then wash off.  Reapply in 7 days. Patient not taking: Reported on 10/04/2017 06/02/17 06/02/18  Minna AntisPaduchowski, Kevin, MD    Allergies Clindamycin/lincomycin and Vancomycin  No family history on file.  Social History Social History   Tobacco Use  . Smoking status: Never Smoker  . Smokeless tobacco: Never Used  Substance Use Topics  . Alcohol use: No  .  Drug use: No    Review of Systems Constitutional: No fever/chills Eyes: No visual changes. ENT: No sore throat. Cardiovascular: Denies chest pain. Respiratory: Denies shortness of breath. Gastrointestinal: No abdominal pain.  No nausea, no vomiting.  No diarrhea.  No constipation. Genitourinary: Negative for dysuria. Musculoskeletal: Negative for neck pain.  Negative for back pain. Integumentary: Positive for rash.  Positive for right ankle wound Neurological: Negative for headaches, focal weakness or numbness.   ____________________________________________   PHYSICAL EXAM:  VITAL SIGNS: ED Triage Vitals [10/04/17 0217]  Enc Vitals Group     BP (!) 143/98     Pulse Rate 98     Resp 16     Temp 97.8 F (36.6 C)     Temp src      SpO2 98 %     Weight 110.7 kg (244 lb)     Height      Head Circumference      Peak Flow      Pain Score      Pain Loc      Pain Edu?      Excl. in GC?     Constitutional: Alert and oriented. Well appearing and in no acute distress. Eyes: Conjunctivae are normal.  Head: Atraumatic. Mouth/Throat: Mucous membranes are moist.  Oropharynx non-erythematous. Neck: No stridor.   Cardiovascular: Normal rate, regular rhythm. Good peripheral circulation. Grossly normal heart sounds. Respiratory: Normal respiratory effort.  No retractions. Lungs CTAB. Gastrointestinal: Soft and nontender. No distention.  Musculoskeletal: No lower extremity tenderness nor edema. No gross deformities of extremities. Neurologic:  Normal speech and language. No gross focal neurologic deficits are appreciated.  Skin: Generalized excoriation bilateral upper extremity and trunk.  Chronic appearing wound to the medial malleoli area of the right ankle no active signs of infection Psychiatric: Mood and affect are normal. Speech and behavior are normal.  ____________________________________________  ____________________________________________   INITIAL IMPRESSION /  ASSESSMENT AND PLAN / ED COURSE  As part of my medical decision making, I reviewed the following data within the electronic MEDICAL RECORD [id number] year old female presenting with generalized pruritus and rash concerning for possible scabies.  In addition patient concerned about wound to the medial right ankle that has been there for a very long time.  Patient given permethrin in the emergency department and referred to the wound care center for her right ankle wound. ____________________________________________  FINAL CLINICAL IMPRESSION(S) / ED DIAGNOSES  Final diagnoses:  Wound of right lower extremity, initial encounter     MEDICATIONS GIVEN DURING THIS VISIT:  Medications  permethrin (ELIMITE) 5 % cream (not administered)     ED Discharge Orders    None       Note:  This document was prepared using Dragon voice recognition software and may include unintentional dictation errors.    Darci CurrentBrown,  N, MD 10/04/17 [phone]

## 2017-10-04 NOTE — ED Triage Notes (Signed)
Pt arrived to ED via EMS from pts [facility] house. Per EMS pt called out for a sore to the right ankle that was caused by bed bugs. Pt is shaking and twitching as well as itching body in triage. Pt has staging ulcer to the right ankle that she has been seen for multiple times in past months. Per EMS pt could have possible bed bugs as well as scabies. Pt has small red dots on body as well as scratch marks form where she sts her "body just itches all the time."

## 2017-10-04 NOTE — Discharge Instructions (Signed)
Please seek medical attention for any high fevers, chest pain, shortness of breath, change in behavior, persistent vomiting, bloody stool or any other new or concerning symptoms.  

## 2018-02-27 IMAGING — DX DG ANKLE 2V *R*
2 series · 2 of 2 positions shown · non-contrast
Comparison: Right ankle radiograph 6 days prior 05/01/2017

CLINICAL DATA: Right ankle wound.

EXAM:
RIGHT ANKLE - 2 VIEW

[ankle ap]
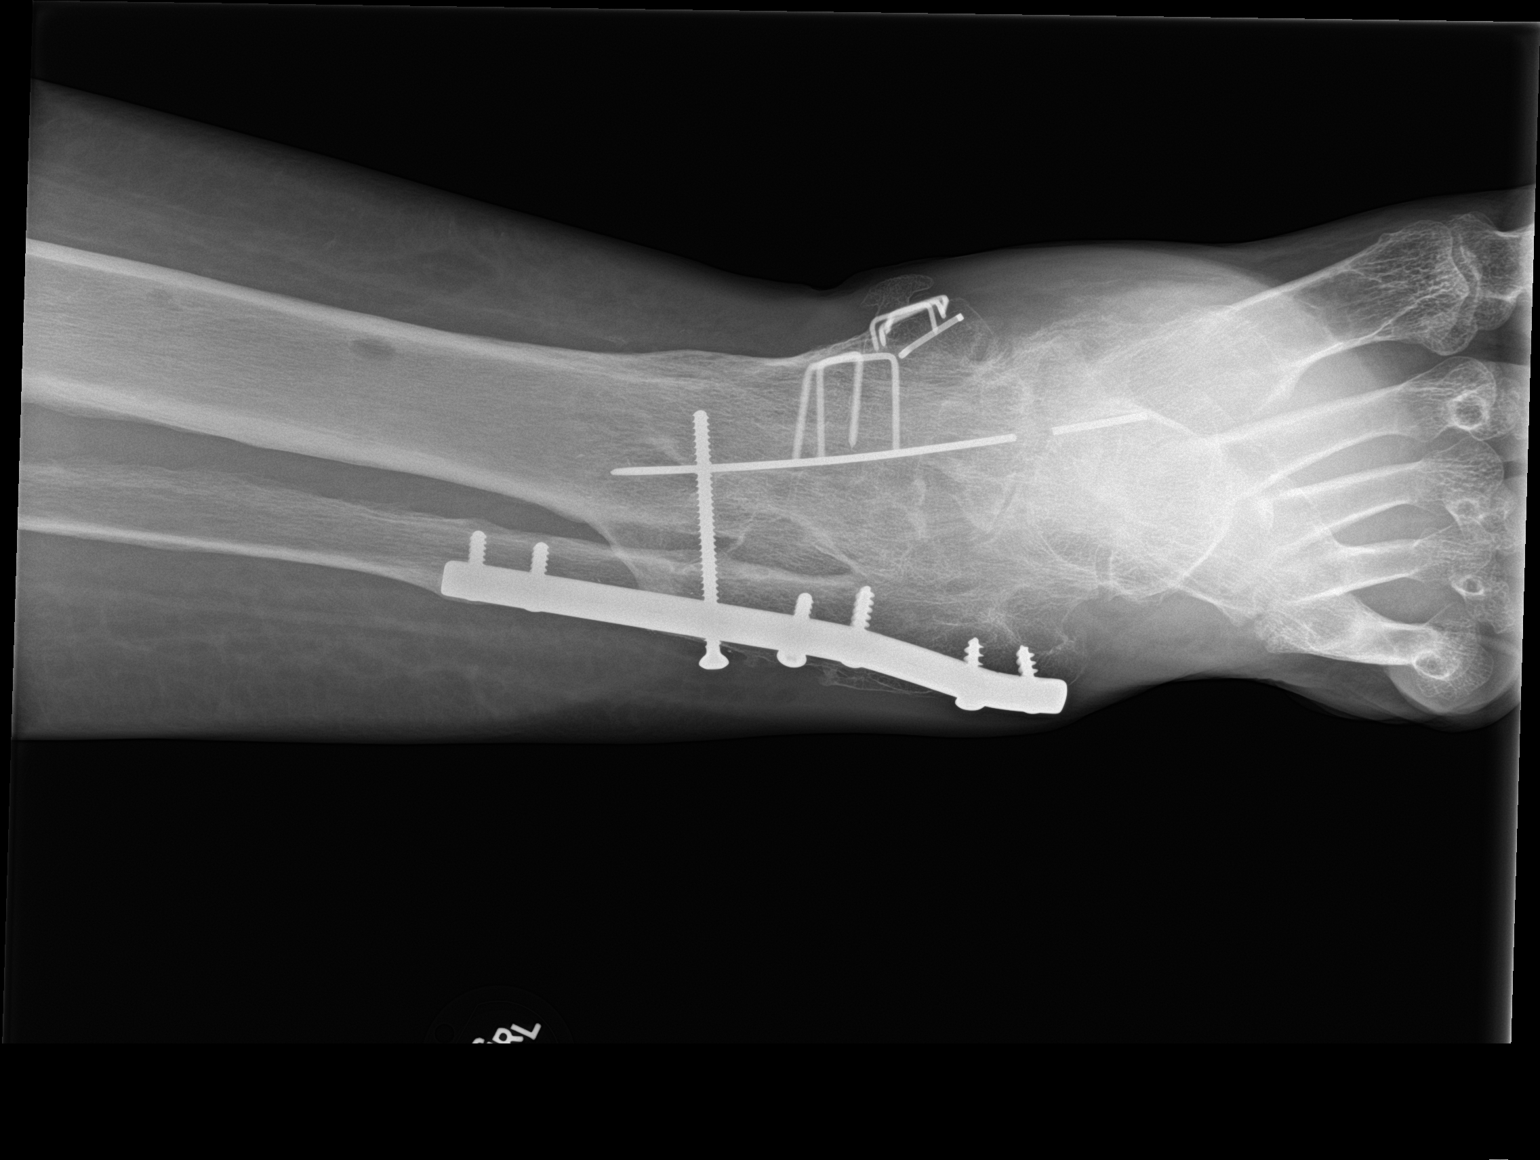

[ankle lat]
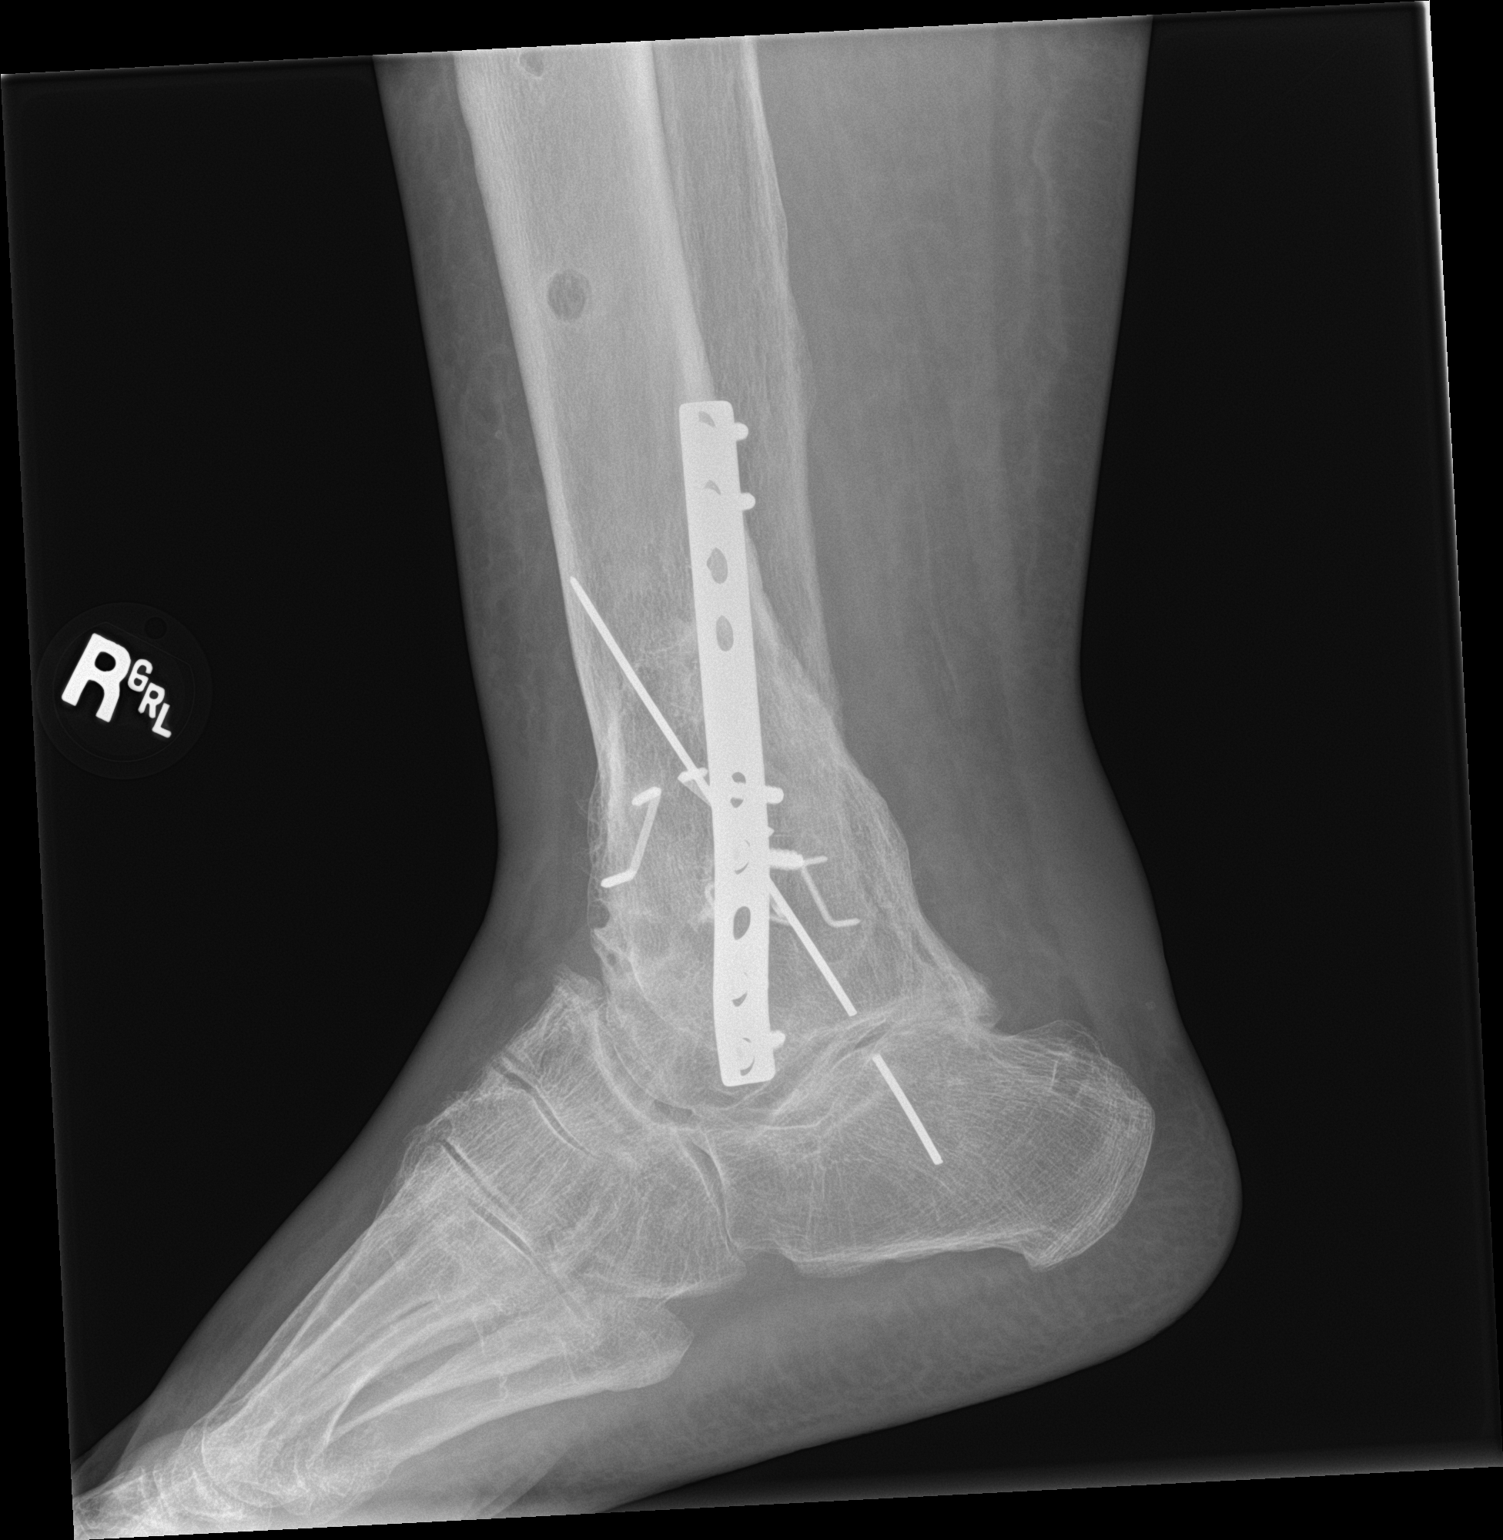

[2 of 2 positions shown; findings below may reference images not displayed]

FINDINGS: No change from prior exam. Post ankle fusion with unchanged surgical
hardware. A wire traversing the tibial talar joint projects to the
subtalar joint, with additional wire fragment in the calcaneus,
unchanged in appearance from prior exam. Lateral plate and screw
fixation of the distal fibula. Ghost tracks in the distal fibula.
Degenerative talonavicular osteoarthritis. No bony destructive
change. No acute fracture. Generalized soft tissue edema. No
radiopaque foreign body. Sided ulcer is not well seen
radiographically.
IMPRESSION: No change from prior exam. Postsurgical and degenerative change post
ankle joint fusion, no evidence of acute osseous abnormality or
osteomyelitis.

Generalized soft tissue edema.

## 2018-03-31 DEATH — deceased

## 2018-08-03 IMAGING — US US EXTREM LOW*R* LIMITED
1 series · 9 of 9 positions shown · non-contrast
Comparison: None.

CLINICAL DATA: Diabetic pressure ulcer medial right ankle.

EXAM:
ULTRASOUND RIGHT LOWER EXTREMITY LIMITED
TECHNIQUE: Ultrasound examination of the lower extremity soft tissues was
performed in the area of clinical concern.

[Series 1: us extrem low*right* limited · 0.07mm/px · 9 of 9 slices shown]
[im 1/9]
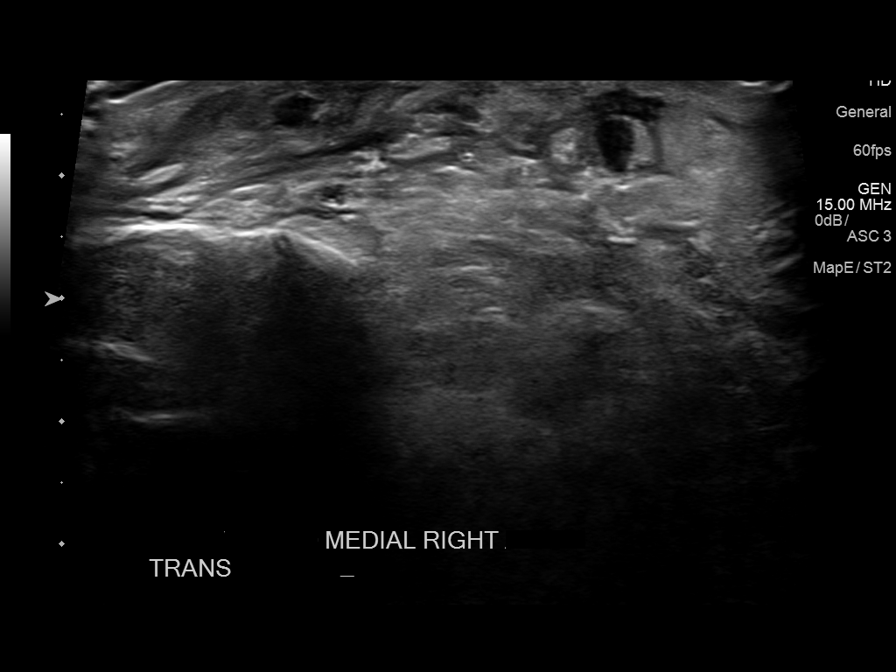
[im 2/9]
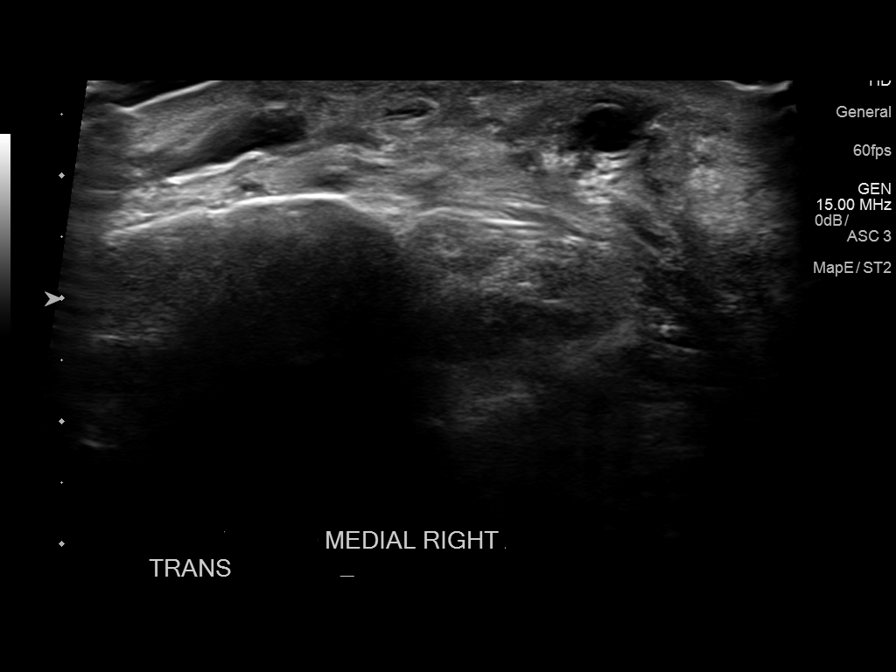
[im 3/9]
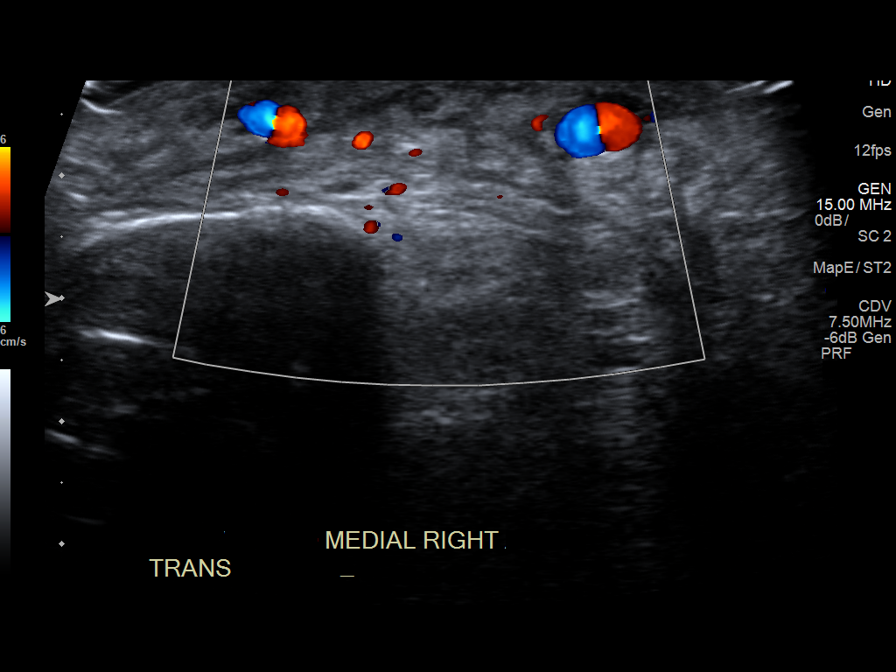
[im 4/9]
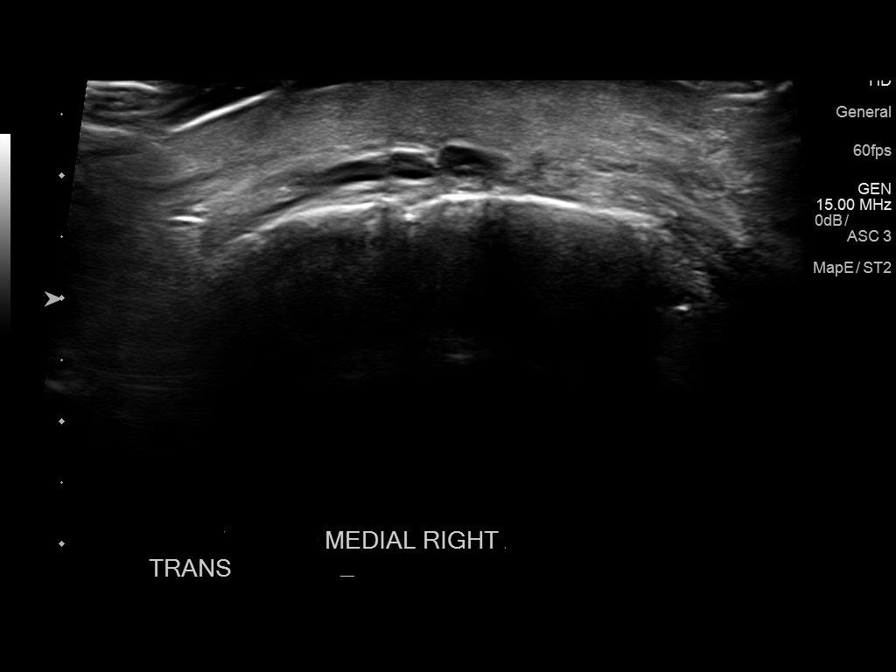
[im 5/9]
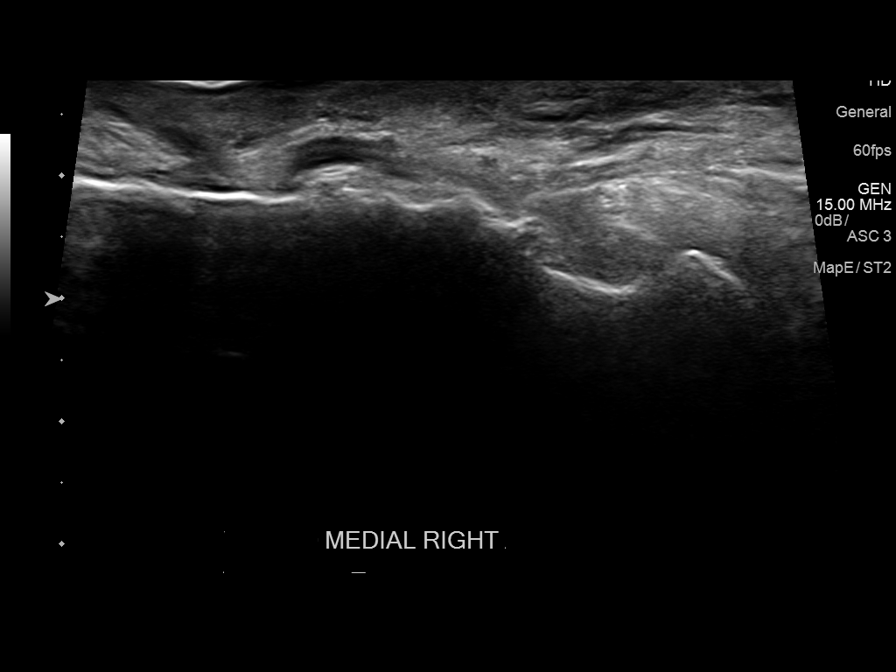
[im 6/9]
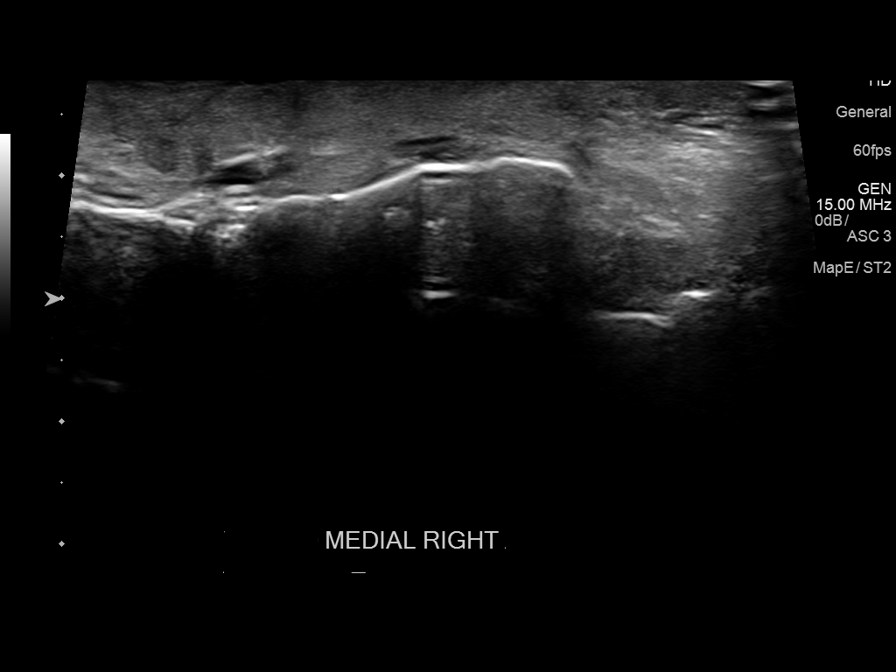
[im 7/9]
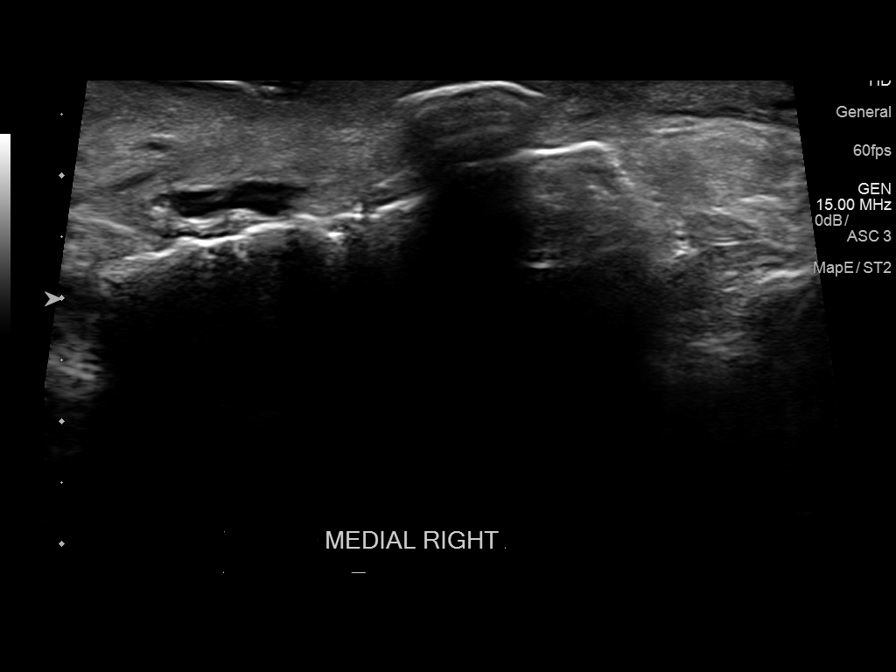
[im 8/9]
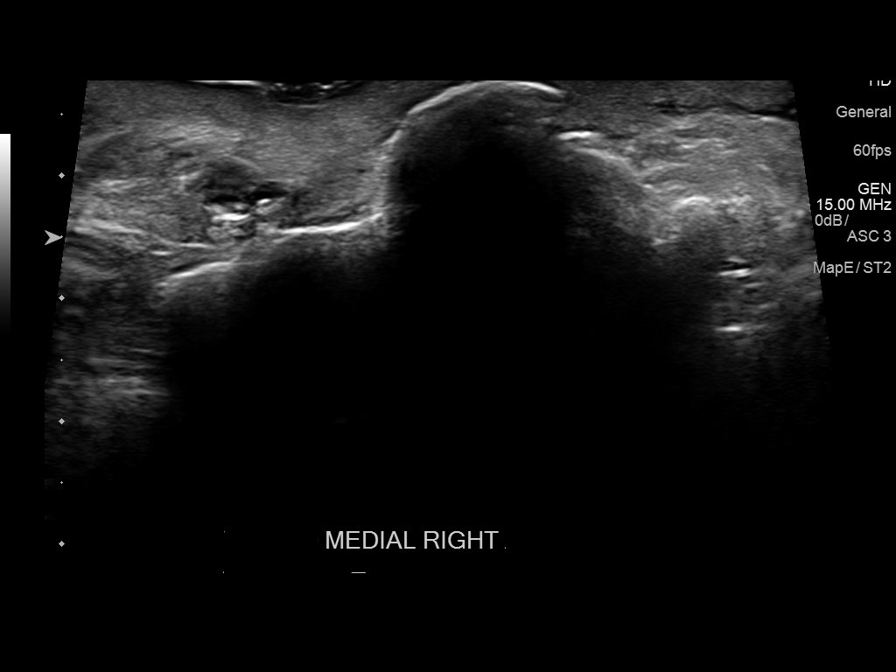
[im 9/9]
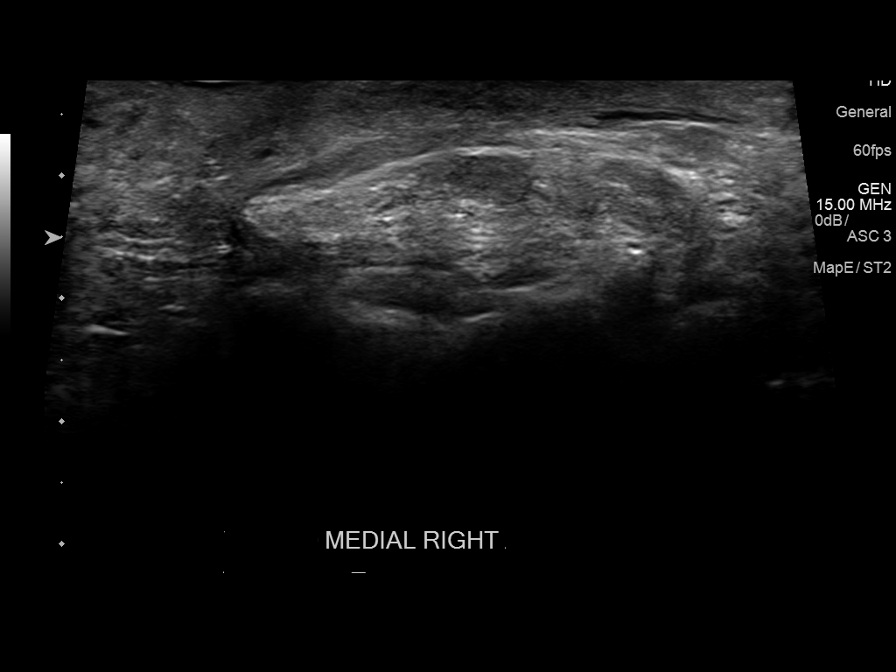

[9 of 9 positions shown; findings below may reference images not displayed]

FINDINGS: Focused ultrasound exam was performed in the medial aspect of the
right ankle. Soft tissue edema is evident, but no discrete or
drainable fluid collection is identified.
IMPRESSION: No evidence for drainable abscess within the subcutaneous tissues of
the right ankle.
